# Patient Record
Sex: Male | Born: 1975
Health system: Southern US, Community
[De-identification: ages and names within clinical notes are randomized; demographics above are authoritative.]

## PROBLEM LIST (undated history)

## (undated) DIAGNOSIS — M109 Gout, unspecified: Secondary | ICD-10-CM

## (undated) HISTORY — PX: APPENDECTOMY: SHX54

## (undated) HISTORY — DX: Gout, unspecified: M10.9

---

## 2014-12-24 ENCOUNTER — Ambulatory Visit (INDEPENDENT_AMBULATORY_CARE_PROVIDER_SITE_OTHER): Payer: 59 | Admitting: Physician Assistant

## 2014-12-24 VITALS — BP 118/86 | HR 86 | Temp 97.8°F | Resp 16 | Ht 70.0 in | Wt 186.0 lb

## 2014-12-24 DIAGNOSIS — Z23 Encounter for immunization: Secondary | ICD-10-CM | POA: Diagnosis not present

## 2014-12-24 DIAGNOSIS — M79672 Pain in left foot: Secondary | ICD-10-CM

## 2014-12-24 MED ORDER — MELOXICAM 15 MG PO TABS: 15.0000 mg | ORAL_TABLET | Freq: Every day | ORAL | Status: DC

## 2014-12-24 NOTE — Progress Notes (Signed)
Subjective:   Patient ID: Mason FlockCHRISTOPHER Harris, male     DOB: [birth year], 39 y.o.    MRN: [id number]  PCP: No PCP Per Patient  Chief Complaint  Patient presents with  . Foot Pain    Left, onset yesterday    HPI  Presents for evaluation of LEFT foot pain  Started yesterday morning when he awoke. Feels "like a real bad jellyfish sting." Pain at rest, much worse with weight bearing.  Awoke him from sleep this morning about 2 am. Tried ice without benefit.  Had "the exact same thing" 2 years ago, and was evaluated elsewhere. Xrays were negative. He was given meloxicam and an antibiotic, and told that they had no idea what was causing it but hoped that one of those medications would help. The symptoms resolved.  No recent trauma or injury. No change in shoes or recent prolonged positions (like crouching) that he can recall). No history of gout.  Prior to Admission medications   Not on File     No Known Allergies   There are no active problems to display for this patient.    Family History  Problem Relation Age of Onset  . Hyperlipidemia Father      Social History   Social History  . Marital Status: Married    Spouse Name: Carollee HerterShannon  . Number of Children: 2  . Years of Education: college   Occupational History  . apartment Dealercomplex manager    Social History Main Topics  . Smoking status: Former Games developermoker  . Smokeless tobacco: Current User  . Alcohol Use: 2.4 - 3.0 oz/week    4-5 Standard drinks or equivalent per week  . Drug Use: No  . Sexual Activity: Not on file   Other Topics Concern  . Not on file   Social History Narrative   Lives with his wife and their two daughters.        Review of Systems  Constitutional: Negative for fever and chills.  Musculoskeletal: Positive for joint swelling, arthralgias and gait problem. Negative for myalgias and back pain.  Skin: Negative for color change, rash and wound.  Neurological: Negative for weakness and  numbness.         Objective:  Physical Exam  Constitutional: He is oriented to person, place, and time. He appears well-developed and well-nourished. He is active and cooperative. No distress.  BP 118/86 mmHg  Pulse 86  Temp(Src) 97.8 F (36.6 C) (Oral)  Resp 16  Ht 5\' 10"  (1.778 m)  Wt 186 lb (84.369 kg)  BMI 26.69 kg/m2  SpO2 98%   Eyes: Conjunctivae are normal.  Cardiovascular:  Pulses:      Dorsalis pedis pulses are 2+ on the right side, and 2+ on the left side.       Posterior tibial pulses are 2+ on the right side, and 2+ on the left side.  Pulmonary/Chest: Effort normal.  Musculoskeletal:       Right ankle: Normal. Achilles tendon normal.       Left ankle: Normal. Achilles tendon normal.       Right lower leg: Normal.       Left lower leg: Normal.       Right foot: Normal.       Left foot: There is tenderness and swelling (very mild). There is normal range of motion, no bony tenderness, normal capillary refill, no crepitus, no deformity and no laceration.       Feet:  Neurological: He is alert  and oriented to person, place, and time.  Psychiatric: He has a normal mood and affect. His speech is normal and behavior is normal.             Assessment & Plan:  1. Acute foot pain, left No etiology identified. Consider gout, but no erythema and only minimal swelling. No evidence of cellulitis. NSAID. Rest. Elevate. RTC if symptoms worsen/persist. - meloxicam (MOBIC) 15 MG tablet; Take 1 tablet (15 mg total) by mouth daily.  Dispense: 30 tablet; Refill: 1  2. Need for influenza vaccination - Flu Vaccine QUAD 36+ mos IM   Fernande Bras, PA-C Physician Assistant-Certified [facility]

## 2014-12-24 NOTE — Patient Instructions (Signed)
Stay off of your foot as much as you can over the next several days.

## 2014-12-25 ENCOUNTER — Encounter (HOSPITAL_COMMUNITY): Payer: Self-pay | Admitting: Emergency Medicine

## 2014-12-25 ENCOUNTER — Emergency Department (HOSPITAL_COMMUNITY): Payer: 59

## 2014-12-25 ENCOUNTER — Emergency Department (HOSPITAL_COMMUNITY)
Admission: EM | Admit: 2014-12-25 | Discharge: 2014-12-25 | Disposition: A | Discharge status: Home / Self Care | Payer: 59 | Attending: Emergency Medicine | Admitting: Emergency Medicine

## 2014-12-25 DIAGNOSIS — Z87891 Personal history of nicotine dependence: Secondary | ICD-10-CM | POA: Insufficient documentation

## 2014-12-25 DIAGNOSIS — Z791 Long term (current) use of non-steroidal anti-inflammatories (NSAID): Secondary | ICD-10-CM | POA: Diagnosis not present

## 2014-12-25 DIAGNOSIS — M79672 Pain in left foot: Secondary | ICD-10-CM | POA: Insufficient documentation

## 2014-12-25 MED ORDER — HYDROCODONE-ACETAMINOPHEN 5-325 MG PO TABS: 1.0000 | ORAL_TABLET | Freq: Four times a day (QID) | ORAL | Status: DC | PRN

## 2014-12-25 MED ORDER — INDOMETHACIN 50 MG PO CAPS: 50.0000 mg | ORAL_CAPSULE | Freq: Three times a day (TID) | ORAL | Status: DC

## 2014-12-25 MED ORDER — PREDNISONE 10 MG PO TABS: ORAL_TABLET | ORAL | Status: DC

## 2014-12-25 NOTE — ED Provider Notes (Signed)
CSN: [id number]     Arrival date & time 12/25/14  0845 History   First MD Initiated Contact with Patient 12/25/14 [phone]     Chief Complaint  Patient presents with  . Foot Pain     (Consider location/radiation/quality/duration/timing/severity/associated sxs/prior Treatment) Patient is a [age] y.o. male presenting with lower extremity pain.  Foot Pain Associated symptoms include joint swelling. Pertinent negatives include no coughing.  39 yo CM presenting with left foot pain. Patient states it started slightly bothering him on Tuesday night, increasing in intensity throughout the past two days. On Tuesday, he went out for a family dinner consisting of steak and multiple glasses of wine. He was seen by urgent care yesterday (10/20) where he received Meloxicam. Despite using this medication, the area has become more swollen and painful. He now is having difficulty bearing weight secondary to pain. He denies any alleviating factors and admits to worsening pain with bearing weight. He also notes erythema of the area which he states has continued to grow in side.  No history of gout, no puncture wounds, insect bites, no IVDU, or other injuries to this foot.  No PCP   History reviewed. No pertinent past medical history. Past Surgical History  Procedure Laterality Date  . Appendectomy     Family History  Problem Relation Age of Onset  . Hyperlipidemia Father    Social History  Substance Use Topics  . Smoking status: Former Games developermoker  . Smokeless tobacco: Current User  . Alcohol Use: 2.4 - 3.0 oz/week    4-5 Standard drinks or equivalent per week    Review of Systems  Constitutional: Negative.   Respiratory: Negative for cough, chest tightness and shortness of breath.   Cardiovascular: Negative.   Musculoskeletal: Positive for joint swelling and gait problem.       Unable to bear weight secondary to pain.   Skin: Positive for color change.      Allergies  Review of patient's allergies  indicates no known allergies.  Home Medications   Prior to Admission medications   Medication Sig Start Date End Date Taking? Authorizing Provider  meloxicam (MOBIC) 15 MG tablet Take 1 tablet (15 mg total) by mouth daily. 12/24/14   Chelle Jeffery, PA-C   BP 119/93 mmHg  Pulse 70  Temp(Src) 97.4 F (36.3 C)  Resp 18  Ht 5\' 10"  (1.778 m)  Wt 186 lb (84.369 kg)  BMI 26.69 kg/m2  SpO2 96% Physical Exam  Constitutional: He is oriented to person, place, and time. He appears well-developed and well-nourished.  HENT:  Head: Normocephalic and atraumatic.  Cardiovascular: Normal rate, regular rhythm, normal heart sounds and intact distal pulses.  Exam reveals no gallop and no friction rub.   No murmur heard. Pulmonary/Chest: Effort normal and breath sounds normal. No respiratory distress. He has no wheezes. He has no rales. He exhibits no tenderness.  Abdominal: Soft. Bowel sounds are normal. He exhibits no distension and no mass. There is no tenderness. There is no rebound and no guarding.  Musculoskeletal: Normal range of motion.       Right foot: There is tenderness and swelling.       Feet:  Focal tenderness to the tarsal joint as depicted in image. Swelling of this area. Erythema surrounding this area. No increased warmth coming from the area. Did not appear cellulitic in nature.  Full ROM and 5/5 strength of bilateral LE Neurovascularly intact.   Neurological: He is alert and oriented to person, place, and time.  Skin: Skin is warm, dry and intact. There is erythema.  Psychiatric: He has a normal mood and affect. His behavior is normal. Judgment and thought content normal.  Vitals reviewed.   ED Course  Procedures (including critical care time) Labs Review Labs Reviewed - No data to display  Imaging Review No results found. I have personally reviewed and evaluated these images and lab results as part of my medical decision-making.   EKG Interpretation None      MDM     Final diagnoses:  None   Kaiden Mcmanigal presents with left foot paiPepperat has increased in intensity despite Meloxicam he received from the urgent care yesterday. No prior imaging of the foot had been done at this time. Differentials include stress fracture, gout, septic joint. Images were obtained.  After reviewing x-rays, differentials were between gout and septic joint. Because there were no fevers/chills, no increased warmth to the area, and a history suggestive of gout, indomethicin and prednisone were given for home, as well as Norco for pain. This was discussed with the patient who expressed a clear understanding of our treatment plan. I encouraged him to start seeing a PCP and to follow up with them (or urgent care) in 48 hours to ensure the joint was healing properly or to return if pain did not improve.       [facility] Tabor Bartram, PA-C 12/25/14 1418  Jerelyn Scott, MD 12/25/14 [phone]

## 2014-12-25 NOTE — ED Notes (Signed)
Pt c/o pain and swelling to left foot onset Wednesday morning. Pt denies recent injury. Pt seen at urgent care yesterday. Pt has tried prescribed meloxicam and hydrocodone with some relief from pain.

## 2014-12-25 NOTE — Discharge Instructions (Signed)
Take medications as prescribed.  Please return to clinic if fever/chills, worsening pain, or any additional concerns. Follow up with PCP or urgent care in approximately 48 hours to ensure proper healing.    Emergency Department Resource Guide 1) Find a Doctor and Pay Out of Pocket Although you won't have to find out who is covered by your insurance plan, it is a good idea to ask around and get recommendations. You will then need to call the office and see if the doctor you have chosen will accept you as a new patient and what types of options they offer for patients who are self-pay. Some doctors offer discounts or will set up payment plans for their patients who do not have insurance, but you will need to ask so you aren't surprised when you get to your appointment.  2) Contact Your Local Health Department Not all health departments have doctors that can see patients for sick visits, but many do, so it is worth a call to see if yours does. If you don't know where your local health department is, you can check in your phone book. The CDC also has a tool to help you locate your state's health department, and many state websites also have listings of all of their local health departments.  3) Find a Walk-in Clinic If your illness is not likely to be very severe or complicated, you may want to try a walk in clinic. These are popping up all over the country in pharmacies, drugstores, and shopping centers. They're usually staffed by nurse practitioners or physician assistants that have been trained to treat common illnesses and complaints. They're usually fairly quick and inexpensive. However, if you have serious medical issues or chronic medical problems, these are probably not your best option.  No Primary Care Doctor: - Call Health Connect at  [phone] - they can help you locate a primary care doctor that  accepts your insurance, provides certain services, etc. - Physician Referral Service-  [phone]  Chronic Pain Problems: Organization         Address  Phone   Notes  [facility]  [phone] Patients need to be referred by their primary care doctor.   Medication Assistance: Organization         Address  Phone   Notes  [facility] [address] [place], [place] [address] [phone]) [phone] --Must be a resident of [facility] -- Must have NO insurance coverage whatsoever (no Medicaid/ Medicare, etc.) -- The pt. MUST have a primary care doctor that directs their care regularly and follows them in the community   MedAssist  [phone]   [employer]  [phone]    Agencies that provide inexpensive medical care: Organization         Address  Phone   Notes  [facility]  [phone]   [facility]    [phone]) [phone]   [facility] [address] [place], [place] [address] [phone]) [phone]   [facility] [address], [place] [phone]   Planned Parenthood    [phone]   [facility]    [phone]   [facility]  [address], [place] Phone:  [phone](336) [phone], Fax:  [phone] Hours of Operation:  9 am - 6 pm, M-F.  Also accepts Medicaid/Medicare and self-pay.  [facility]  [address], [address], [place] Phone: [phone], Fax: [phone]. Hours of Operation:  8:30 am - 5:30 pm, M-F.  Also accepts Medicaid and self-pay.  [facility] [address], [place] Phone: [phone]   [place], [place], [place] [phone], Ext. [phone] Mondays & Thursdays: 7-9 AM.  First 15 patients are seen on a first come, first serve basis.    [place] Providers:  Organization         Address  Phone   Notes  [facility] [address],  [place] [phone] Also accepts self-pay patients.  [facility] [address], [place]  [phone]   [place], [address], [place] [phone]   [facility] [address] [place] [phone]   Lucianne Lei [address], [place]   [phone] Only accepts [place] Access [place] patients after they have their name applied to their card.   Self-Pay (no insurance) in [facility]:  Organization         Address  Phone   Notes  Sickle Cell Patients, [facility] [place] [phone]   [facility] [place] [phone]   [facility] [place]  [place], [place], [place] [phone]   [facility]/Dr. Osei-Bonsu  [address], [place] or [address], [place] [phone] Phone number for both [place] and [place] locations is the same.  [facility] [address], [place] [phone]   [facility] [address] [place] or [address] [phone]   [facility] [address] [place] [phone], phone; [phone], fax Sees patients 1st and 3rd Saturday of every month.  Must not qualify for public or private insurance (i.e. Medicaid, Medicare, [place] Health Choice, Veterans' Benefits)  Household income should be no more than 200% of the poverty level The clinic cannot treat you if you are pregnant or think you are pregnant  Sexually transmitted diseases are not treated at the clinic.    Dental Care: Organization         Address  Phone  Notes  [facility] Department of [facility] [place] [phone] Accepts children up to age 50 who are enrolled in  [place] or [place]; pregnant women with a Medicaid card; and children who have applied for Medicaid or [place] Health Choice, but were declined, whose parents can pay a reduced fee at time of service.  [facility] Department of [facility]  [address], [place] [phone] Accepts children up to age 26 who are enrolled in [place] or [place]; pregnant women with a Medicaid card; and children who have applied for Medicaid or [place] Health Choice, but were declined, whose parents can pay a reduced fee at time of service.  [place] Adult [facility] Access PROGRAM  [place] [phone] Patients are seen by appointment only. Walk-ins are not accepted. [place] will see patients 70 years of age and older. Monday - Tuesday (8am-5pm) Most Wednesdays (8:30-5pm) $30 per visit, cash only  [place] Adult  Dental Access PROGRAM  [address], [facility] [phone] Patients are seen by appointment only. Walk-ins are not accepted. [place] will see patients 42 years of age and older. One Wednesday Evening (Monthly: Volunteer Based).  $30 per visit, cash only  [place]  [phone] for adults; Children under age 54, call Graduate Pediatric Dentistry at [phone]. Children aged 65-14, please call [phone] to request a pediatric application.  Dental services are provided in all areas of dental care including fillings, crowns and bridges, complete and partial dentures, implants, gum treatment, root canals, and extractions. Preventive care is also provided. Treatment is provided to both adults and children. Patients are selected via a lottery and there is often a waiting list.   [facility] [address] [place]  [phone] [email or web address]   [facility] [address] [place], [place] [phone], Ext. [phone] Second and Fourth Thursday of each month, opens at 6:30  AM; Clinic ends at 9 AM.  Patients are seen on a first-come first-served basis, and a limited number are seen during each clinic.   [facility]  [address] Hillard Danker [place], [place] [phone]   Eligibility Requirements You must have lived in [place], [place], or [place] counties for at least the last three months.   You cannot be eligible for state or federal sponsored [employer], including [employer], [place], or [employer].   You generally cannot be eligible for healthcare insurance through your employer.    How to apply: Eligibility screenings are held every Tuesday and Wednesday afternoon from 1:00 pm until 4:00 pm. You do not need an appointment for the interview!  [facility] [address] [place], [place]   [place]  [place] Department  [place]  [phone]    Behavioral Health Resources in the Community: Intensive Outpatient Programs Organization         Address  Phone  Notes  [place]. [address], [place], [place] [phone]   [facility] Outpatient [address], [place], [place]   ADS: Alcohol & Drug Svcs [address] [place], [place]   [place] [address]  [place], [place] or [phone]   Substance Abuse Resources Organization         Address  Phone  Notes  Alcohol and Drug Services  [phone]   [place]  [phone]   The [place]   Chinita Pester  [phone]   Residential & Outpatient Substance Abuse Program  [phone]   Psychological Services Organization         Address  Phone  Notes  [facility] [place]  [place]  [phone]   [place] [address], [place] [phone] or  [phone]    Mobile Crisis Teams Organization         Address  Phone  Notes  [facility], Mobile Crisis Care Unit  [phone]   [facility]  [address] [place], [place]   [place] Levels [address] [place] [phone]    Self-Help/Support Groups Organization         Address  Phone             Notes  Mental  Health Assoc. of [place] - variety of support groups  [place] Call for more information  Narcotics Anonymous (NA), [facility]aring [facility] [address], [employer] [place]  2 meetings at this location   Special educational needs teacher         Address  Phone  Notes  ASAP Residential Treatment [place],    [place]  [phone]   [facility]  [address] [place] [address], [place], [place]   [place]ellie [place], [place] [phone] Admissions: 8am-3pm M-F  Incentives Substance [place] [address]    [place], [place] [phone]   [facility] [address] [place], [place], [place]   [facility] [address],  [place], [place]   Insight Programs - Intensive Outpatient [address], [place], [place]   [facility] [address]  [place], [place] [phone] or [phone]   [facility]idential Treatment Services (RTS) [address], [place], [place] Accepts Medicaid  [facility] [address]  [place] [phone] Substance Abuse/Addiction Treatment   [facility] Organization         Address  Phone  Notes  [facility]  [phone]   Domenic Schwab, PhD [address] Arlis Porta [place], [place]   [phone] or [phone]   [facility], [place] [phone]   [facility] [address],  [place], [place] [phone] Insurance/Medicaid/sponsorship through [facility] and Families [address], [address]                                    [place], [place] [phone] [place] [address], [place] [phone]    Dr. Adele Schilder  [phone]   [facility] Dept. 1) [address] [place] 2) [place] 3)  [place] [address], [place] [phone]  [phone]   [place] [phone] or [phone] (After Hours)

## 2015-11-29 ENCOUNTER — Encounter (HOSPITAL_COMMUNITY): Payer: Self-pay | Admitting: Emergency Medicine

## 2015-11-29 ENCOUNTER — Emergency Department (HOSPITAL_COMMUNITY)
Admission: EM | Admit: 2015-11-29 | Discharge: 2015-11-29 | Disposition: A | Discharge status: Home / Self Care | Payer: 59 | Attending: Emergency Medicine | Admitting: Emergency Medicine

## 2015-11-29 DIAGNOSIS — M62830 Muscle spasm of back: Secondary | ICD-10-CM | POA: Insufficient documentation

## 2015-11-29 DIAGNOSIS — Z87891 Personal history of nicotine dependence: Secondary | ICD-10-CM | POA: Diagnosis not present

## 2015-11-29 DIAGNOSIS — M545 Low back pain: Secondary | ICD-10-CM | POA: Diagnosis present

## 2015-11-29 MED ORDER — NAPROXEN 500 MG PO TABS: 500.0000 mg | ORAL_TABLET | Freq: Two times a day (BID) | ORAL | 0 refills | Status: DC

## 2015-11-29 MED ORDER — METHOCARBAMOL 500 MG PO TABS: 500.0000 mg | ORAL_TABLET | Freq: Two times a day (BID) | ORAL | 0 refills | Status: DC

## 2015-11-29 NOTE — ED Triage Notes (Addendum)
Rt back pain  Started yesterday has tried , ice heat motrin , not working , was given a flexeril by dad and that helped some, shooting radiating pain is one spot nothing helps but laysing on back, , no numbness tingling  Has control of bowels and bladder , can walk, denies injury  excpet when he stepped inhole   Last thursday

## 2015-11-29 NOTE — ED Notes (Signed)
Declined W/C at D/C and was escorted to lobby by RN. 

## 2015-11-29 NOTE — ED Provider Notes (Signed)
[facility]-EMERGENCY DEPT Provider Note   CSN: [id number] Arrival date & time: 11/29/15  1304   By signing my name below, I, Clovis PuAvnee Patel, attest that this documentation has been prepared under the direction and in the presence of  Felicie Mornavid Alaila Pillard, NP. Electronically Signed: Clovis PuAvnee Patel, ED Scribe. 11/29/15. 4:43 PM.   History   Chief Complaint Chief Complaint  Patient presents with  . Back Pain    The history is provided by the patient. No language interpreter was used.  Back Pain   This is a new problem. The current episode started yesterday. The problem occurs constantly. The problem has not changed since onset.The pain does not radiate. The pain is moderate. Exacerbated by: movement. Pertinent negatives include no numbness, no bowel incontinence, no bladder incontinence and no tingling. He has tried ice and heat for the symptoms.   HPI Comments:  Mason Harris is a [age] y.o. male who presents to the Emergency Department complaining of lower right sided back pain onset 1 days ago. Pt denies radiation of the pain. He notes the pain is exacerbated with movement. Pt states he was mowing the grass when he stepped in a hole but denies any immediate pain following this incident. He has interchangeably used ice and heat with no relief. Pt denies numbness or tingling in his BLE and bowel or bladder incontinence. He also denies any medical problems, allergies, or being on any medications.   History reviewed. No pertinent past medical history.  There are no active problems to display for this patient.   Past Surgical History:  Procedure Laterality Date  . APPENDECTOMY         Home Medications    Prior to Admission medications   Medication Sig Start Date End Date Taking? Authorizing Provider  HYDROcodone-acetaminophen (NORCO/VICODIN) 5-325 MG tablet Take 1-2 tablets by mouth every 6 (six) hours as needed for moderate pain. 12/25/14   Chase PicketJaime Pilcher Ward, PA-C  indomethacin (INDOCIN) 50 MG  capsule Take 1 capsule (50 mg total) by mouth 3 (three) times daily with meals. 12/25/14   Jaime Pilcher Ward, PA-C  meloxicam (MOBIC) 15 MG tablet Take 1 tablet (15 mg total) by mouth daily. 12/24/14   Chelle Jeffery, PA-C  predniSONE (DELTASONE) 10 MG tablet Take three tabs daily X 3 days, then take two tabs X 2 days, then take one tab daily x 2 days 12/25/14   Chase PicketJaime Pilcher Ward, PA-C    Family History Family History  Problem Relation Age of Onset  . Hyperlipidemia Father     Social History Social History  Substance Use Topics  . Smoking status: Former Games developermoker  . Smokeless tobacco: Current User  . Alcohol use 2.4 - 3.0 oz/week    4 - 5 Standard drinks or equivalent per week     Allergies   Review of patient's allergies indicates no known allergies.   Review of Systems Review of Systems  Gastrointestinal: Negative for bowel incontinence.  Genitourinary: Negative for bladder incontinence.  Musculoskeletal: Positive for back pain.  Neurological: Negative for tingling and numbness.  All other systems reviewed and are negative.    Physical Exam Updated Vital Signs BP 134/95 (BP Location: Right Arm)   Pulse 79   Temp 98.4 F (36.9 C) (Oral)   Resp 16   Wt 185 lb (83.9 kg)   SpO2 100%   BMI 26.54 kg/m   Physical Exam  Constitutional: He is oriented to person, place, and time. He appears well-developed and well-nourished. No distress.  HENT:  Head: Normocephalic and atraumatic.  Eyes: Conjunctivae are normal.  Cardiovascular: Normal rate.   Pulmonary/Chest: Effort normal.  Abdominal: He exhibits no distension.  Musculoskeletal:       Arms: Muscle tenderness in R lower back.   Neurological: He is alert and oriented to person, place, and time.  Skin: Skin is warm and dry.  Psychiatric: He has a normal mood and affect.  Nursing note and vitals reviewed.    ED Treatments / Results  DIAGNOSTIC STUDIES:  Oxygen Saturation is 100% on RA, normal by my  interpretation.    COORDINATION OF CARE:  4:38 PM Discussed treatment plan with pt at bedside and pt agreed to plan.  Labs (all labs ordered are listed, but only abnormal results are displayed) Labs Reviewed - No data to display  EKG  EKG Interpretation None       Radiology No results found.  Procedures Procedures (including critical care time)  Medications Ordered in ED Medications - No data to display   Initial Impression / Assessment and Plan / ED Course  I have reviewed the triage vital signs and the nursing notes.  Pertinent labs & imaging results that were available during my care of the patient were reviewed by me and considered in my medical decision making (see chart for details).  Clinical Course  Patient with back pain.  No neurological deficits and normal neuro exam.  Patient is ambulatory.  No loss of bowel or bladder control.  No concern for cauda equina.  No fever, night sweats, weight loss, h/o cancer, IVDA, no recent procedure to back. No urinary symptoms suggestive of UTI.  Supportive care and return precaution discussed. Appears safe for discharge at this time. Follow up as indicated in discharge paperwork.     Final Clinical Impressions(s) / ED Diagnoses   Final diagnoses:  Muscle spasm of back    New Prescriptions Discharge Medication List as of 11/29/2015  4:46 PM    START taking these medications   Details  methocarbamol (ROBAXIN) 500 MG tablet Take 1 tablet (500 mg total) by mouth 2 (two) times daily., Starting Mon 11/29/2015, Print    naproxen (NAPROSYN) 500 MG tablet Take 1 tablet (500 mg total) by mouth 2 (two) times daily., Starting Mon 11/29/2015, Print      I personally performed the services described in this documentation, which was scribed in my presence. The recorded information has been reviewed and is accurate.     Felicie Morn, NP 11/30/15 1610    Loren Racer, MD 12/01/15 [phone]

## 2015-11-30 ENCOUNTER — Encounter: Payer: Self-pay | Admitting: Family Medicine

## 2015-11-30 ENCOUNTER — Ambulatory Visit (INDEPENDENT_AMBULATORY_CARE_PROVIDER_SITE_OTHER): Payer: 59 | Admitting: Family Medicine

## 2015-11-30 DIAGNOSIS — M545 Low back pain, unspecified: Secondary | ICD-10-CM

## 2015-11-30 NOTE — Patient Instructions (Signed)
You have a lumbar strain with an irritated local nerve. Take naproxen 500mg  twice a day with food - when you run out of this you can take 2 aleve twice a day with food OR ibuprofen 600mg  three times a day with food for pain and inflammation. A prednisone dose pack is an option. Robaxin as needed for muscle spasms (no driving on this medicine if it makes you sleepy). Stay as active as possible. Physical therapy has been shown to be helpful as well - consider this in the future. Do home exercises daily for your back though you may want to wait a couple days before starting these. Strengthening of low back muscles, abdominal musculature are key for long term pain relief. If not improving, will consider further imaging (MRI). Follow up with me in 4 weeks typically but obviously call me in next 1-2 weeks if you're not improving as I would expect.

## 2015-12-02 DIAGNOSIS — M545 Low back pain, unspecified: Secondary | ICD-10-CM | POA: Insufficient documentation

## 2015-12-02 NOTE — Progress Notes (Signed)
PCP: No PCP Per Patient  Subjective:   HPI: Patient is a 40 y.o. male here for low back pain.  Patient reports 2 days ago he started to get pain a couple days after mowing the lawn (pain started 9/24). Remembers stepping in a hole while doing this but no initial pain. Developed pain on right side of low back. Was up to 10/10 and sharp with certain motions. Sunday was so bad that he couldn't get out of bed. No prior issues. No radiation into leg though. Tried naproxen, robaxin, heat/ice. No bowel/bladder dysfunction.  No past medical history on file.  Current Outpatient Prescriptions on File Prior to Visit  Medication Sig Dispense Refill  . HYDROcodone-acetaminophen (NORCO/VICODIN) 5-325 MG tablet Take 1-2 tablets by mouth every 6 (six) hours as needed for moderate pain. 12 tablet 0  . indomethacin (INDOCIN) 50 MG capsule Take 1 capsule (50 mg total) by mouth 3 (three) times daily with meals. 30 capsule 0  . meloxicam (MOBIC) 15 MG tablet Take 1 tablet (15 mg total) by mouth daily. 30 tablet 1  . methocarbamol (ROBAXIN) 500 MG tablet Take 1 tablet (500 mg total) by mouth 2 (two) times daily. 20 tablet 0  . naproxen (NAPROSYN) 500 MG tablet Take 1 tablet (500 mg total) by mouth 2 (two) times daily. 20 tablet 0  . predniSONE (DELTASONE) 10 MG tablet Take three tabs daily X 3 days, then take two tabs X 2 days, then take one tab daily x 2 days 15 tablet 0   No current facility-administered medications on file prior to visit.     Past Surgical History:  Procedure Laterality Date  . APPENDECTOMY      No Known Allergies  Social History   Social History  . Marital status: Married    Spouse name: Carollee HerterShannon  . Number of children: 2  . Years of education: college   Occupational History  . apartment Dealercomplex manager    Social History Main Topics  . Smoking status: Former Games developermoker  . Smokeless tobacco: Current User  . Alcohol use 2.4 - 3.0 oz/week    4 - 5 Standard drinks or  equivalent per week  . Drug use: No  . Sexual activity: Not on file   Other Topics Concern  . Not on file   Social History Narrative   Lives with his wife and their two daughters.    Family History  Problem Relation Age of Onset  . Hyperlipidemia Father     BP (!) 135/104   Pulse 81   Ht 5\' 10"  (1.778 m)   Wt 185 lb (83.9 kg)   BMI 26.54 kg/m   Review of Systems: See HPI above.    Objective:  Physical Exam:  Gen: NAD, comfortable in exam room  Back: No gross deformity, scoliosis. Minimal TTP right lumbar paraspinal region.  No midline or bony TTP.  No SI or piriformis tenderness. FROM. Strength LEs 5/5 all muscle groups.   2+ MSRs in patellar and achilles tendons, equal bilaterally. Negative SLRs. Sensation intact to light touch bilaterally. Negative logroll bilateral hips Negative fabers and piriformis stretches.    Assessment & Plan:  1. Low back injury - 2/2 lumbar strain.  Continue naproxen with robaxin as needed.  He is improving compared to when this occurred.  Start home exercises which were shown today.  Consider physical therapy if not improving.  F/u in 4 weeks.

## 2015-12-02 NOTE — Assessment & Plan Note (Signed)
2/2 lumbar strain.  Continue naproxen with robaxin as needed.  He is improving compared to when this occurred.  Start home exercises which were shown today.  Consider physical therapy if not improving.  F/u in 4 weeks.

## 2016-10-06 IMAGING — DX DG FOOT COMPLETE 3+V*L*
3 series · 3 of 3 positions shown · non-contrast
Comparison: None.

CLINICAL DATA: Redness and swelling.  Medial pain.

EXAM:
LEFT FOOT - COMPLETE 3+ VIEW

[x foot ap left]
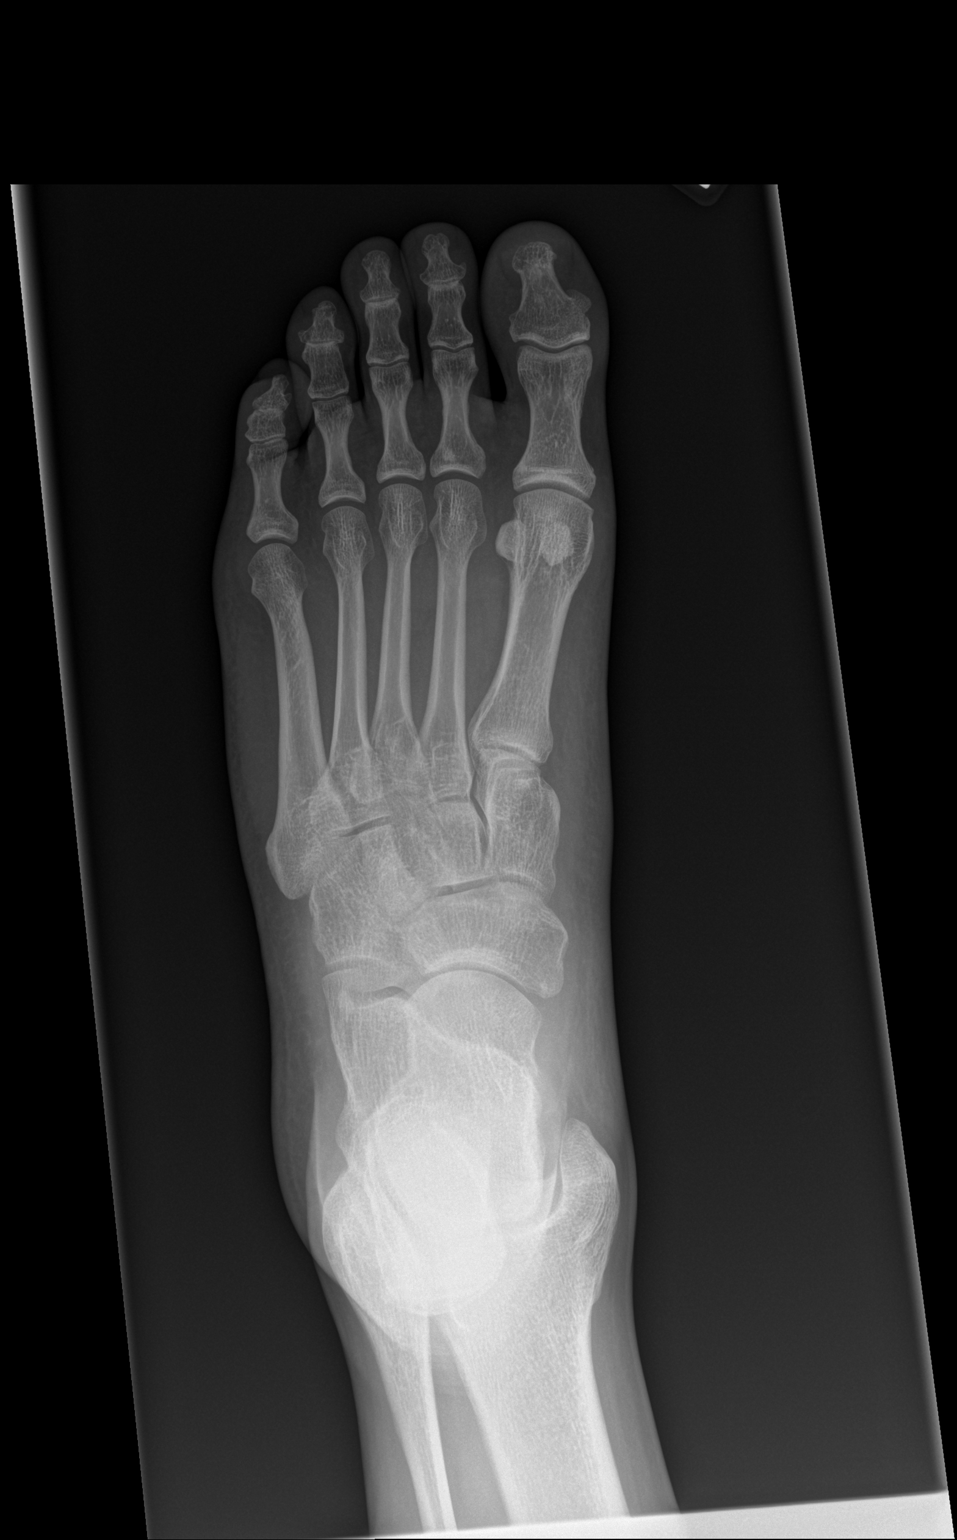

[x foot obl left]
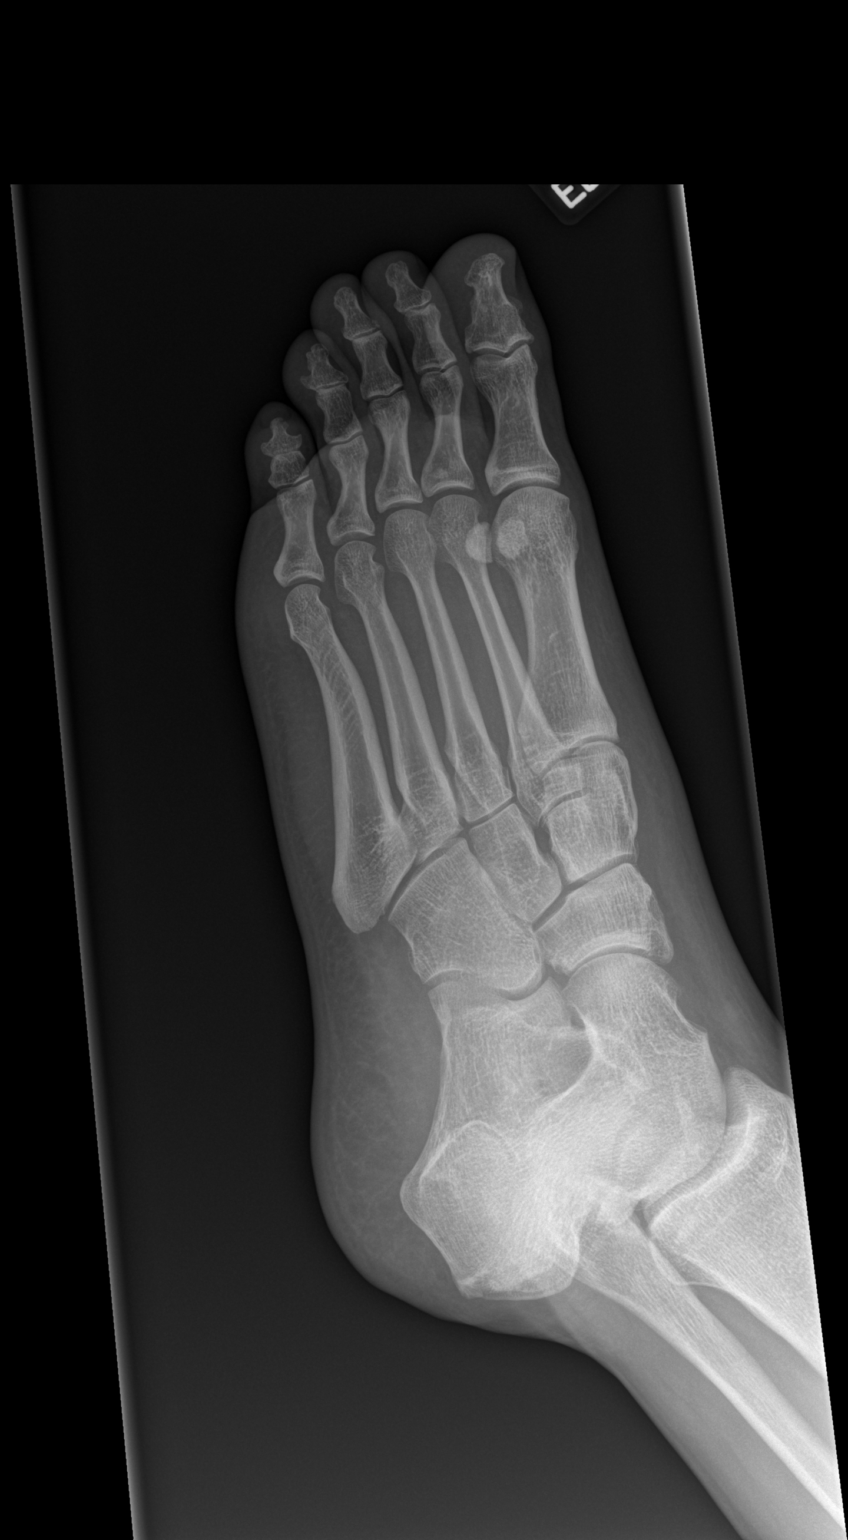

[x foot lat left]
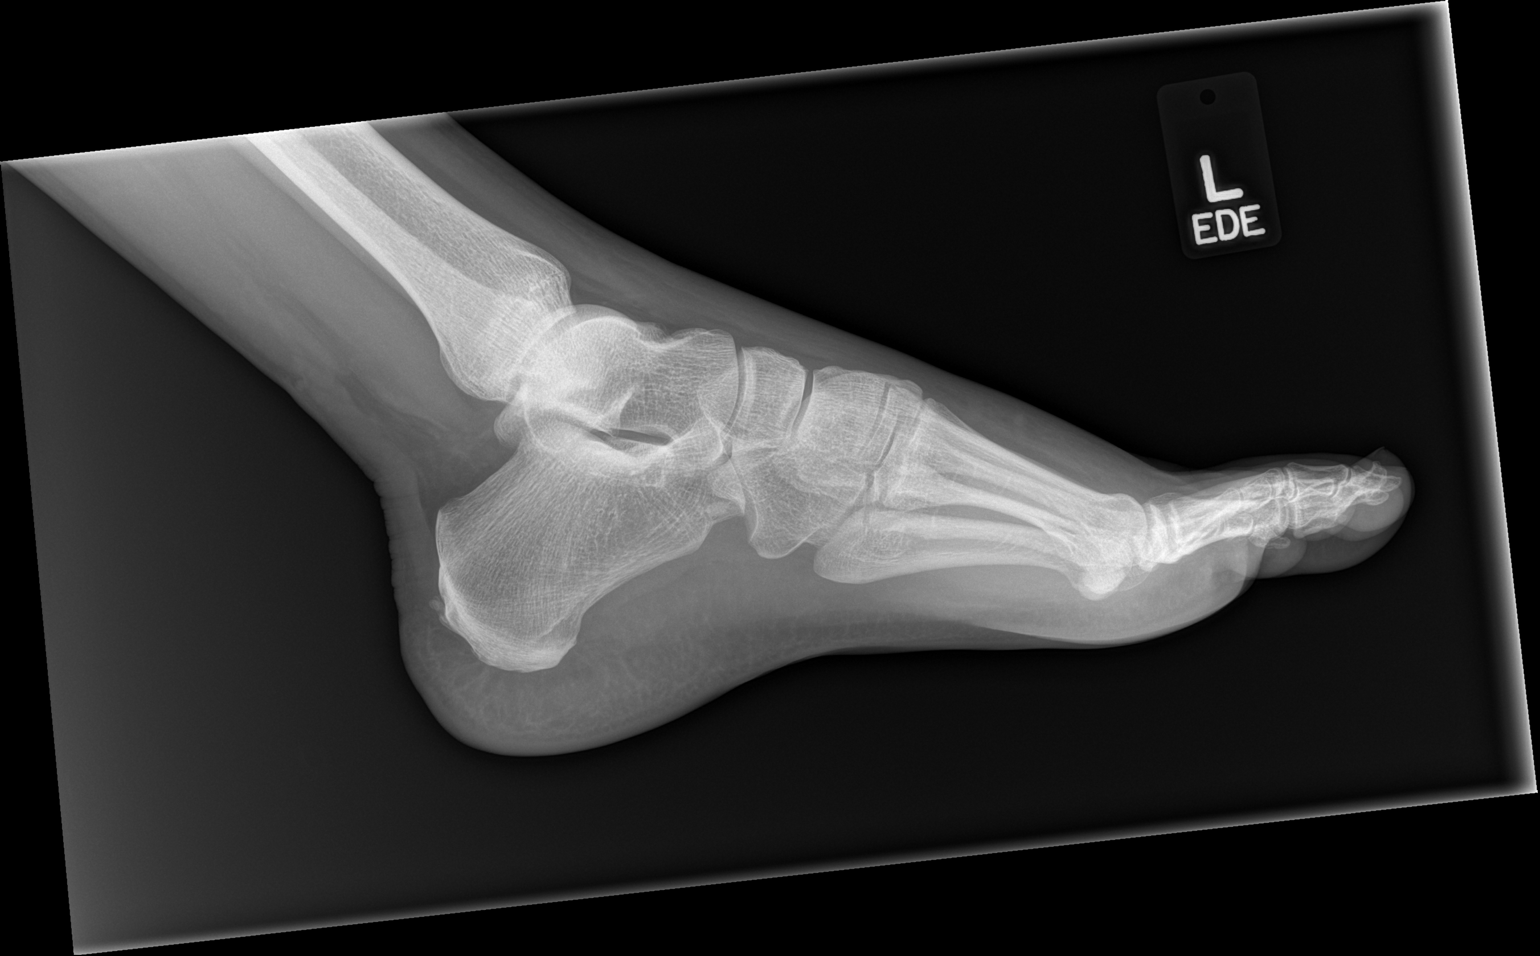

[3 of 3 positions shown; findings below may reference images not displayed]

FINDINGS: There is no evidence of fracture or dislocation. There is no
evidence of arthropathy or other focal bone abnormality. Soft
tissues are unremarkable.
IMPRESSION: No acute osseous injury of the left foot.

## 2017-03-14 ENCOUNTER — Other Ambulatory Visit: Payer: Self-pay

## 2017-03-14 ENCOUNTER — Encounter: Payer: Self-pay | Admitting: Physician Assistant

## 2017-03-14 ENCOUNTER — Ambulatory Visit: Payer: 59 | Admitting: Physician Assistant

## 2017-03-14 VITALS — BP 124/88 | HR 88 | Temp 97.6°F | Resp 16 | Ht 70.0 in | Wt 191.2 lb

## 2017-03-14 DIAGNOSIS — M109 Gout, unspecified: Secondary | ICD-10-CM

## 2017-03-14 MED ORDER — COLCHICINE 0.6 MG PO TABS: ORAL_TABLET | ORAL | 2 refills | Status: DC

## 2017-03-14 NOTE — Patient Instructions (Addendum)
  Come back and see me if you are not getting better.     IF you received an x-ray today, you will receive an invoice from [facility]. Please contact [facility] at [phone] with questions or concerns regarding your invoice.   IF you received labwork today, you will receive an invoice from [facility]. Please contact [facility] at [phone] with questions or concerns regarding your invoice.   Our billing staff will not be able to assist you with questions regarding bills from these companies.  You will be contacted with the lab results as soon as they are available. The fastest way to get your results is to activate your My Chart account. Instructions are located on the last page of this paperwork. If you have not heard from us regarding the results in 2 weeks, please contact this office.

## 2017-03-14 NOTE — Progress Notes (Signed)
    03/14/2017 3:59 PM   DOB: 03/18/1975 / MRN: [id number]  SUBJECTIVE:  Mason Harris is a 42 y.o. male presenting for lateral foot pain time 3 days. No trauma.  He has a history of gout and tells me this feels like that.  Tends to take a steroid and NSAIDS while this occurs.  He gets maybe 2-4 flares a year.  Occurs with undercooked steak and white wine, both of which he had 5 days ago, or if he eats seafood, which he has not had. Denies a history of CKD.  Takes no meds chronically.   He has No Known Allergies.   He  has no past medical history on file.    He  reports that he has quit smoking. He uses smokeless tobacco. He reports that he drinks about 2.4 - 3.0 oz of alcohol per week. He reports that he does not use drugs. He  has no sexual activity history on file. The patient  has a past surgical history that includes Appendectomy.  His family history includes Hyperlipidemia in his father.  Review of Systems  Constitutional: Negative for fever.  Musculoskeletal: Positive for joint pain and myalgias.    The problem list and medications were reviewed and updated by myself where necessary and exist elsewhere in the encounter.   OBJECTIVE:  BP 124/88 (BP Location: Right Arm, Patient Position: Sitting, Cuff Size: Normal)   Pulse 88   Temp 97.6 F (36.4 C) (Oral)   Resp 16   Ht 5\' 10"  (1.778 m)   Wt 191 lb 3.2 oz (86.7 kg)   SpO2 99%   BMI 27.43 kg/m   Physical Exam  Constitutional: He is active and cooperative.  Cardiovascular: Normal rate, regular rhythm, S1 normal, S2 normal, normal heart sounds, intact distal pulses and normal pulses. Exam reveals no gallop and no friction rub.  No murmur heard. Pulmonary/Chest: Effort normal. No tachypnea. He has no rales.  Abdominal: He exhibits no distension.  Musculoskeletal: He exhibits no edema.       Feet:  Neurological: He is alert.  Skin: Skin is warm.  Vitals reviewed.   No results found for: CREATININE, BUN, NA, K, CL,  CO2   No results found for this or any previous visit (from the past 72 hour(s)).  No results found.  ASSESSMENT AND PLAN:  [name] was seen today for foot pain.  Diagnoses and all orders for this visit:  Acute gout of right foot, unspecified cause: Similar to previous symptoms.  He is otherwise healthy and takes no meds. He will come back for labs and further investigation if the colchicine is not working for him.  -     colchicine 0.6 MG tablet; Take two tabs then one tab one hour later.  Okay to repeat in 24 hours.    The patient is advised to call or return to clinic if he does not see an improvement in symptoms, or to seek the care of the closest emergency department if he worsens with the above plan.   Deliah BostonMichael Luba Matzen, MHS, PA-C [facility] at [facility] 03/14/2017 3:59 PM

## 2017-03-15 ENCOUNTER — Ambulatory Visit: Payer: 59 | Admitting: Family Medicine

## 2017-03-15 ENCOUNTER — Encounter: Payer: Self-pay | Admitting: Family Medicine

## 2017-03-15 ENCOUNTER — Telehealth: Payer: Self-pay | Admitting: Physician Assistant

## 2017-03-15 ENCOUNTER — Other Ambulatory Visit: Payer: Self-pay

## 2017-03-15 VITALS — BP 118/80 | HR 99 | Temp 98.4°F | Resp 17 | Ht 70.0 in | Wt 186.8 lb

## 2017-03-15 DIAGNOSIS — M10071 Idiopathic gout, right ankle and foot: Secondary | ICD-10-CM

## 2017-03-15 MED ORDER — INDOMETHACIN 50 MG PO CAPS: 50.0000 mg | ORAL_CAPSULE | Freq: Three times a day (TID) | ORAL | 0 refills | Status: DC

## 2017-03-15 MED ORDER — PREDNISONE 10 MG PO TABS: ORAL_TABLET | ORAL | 0 refills | Status: DC

## 2017-03-15 NOTE — Progress Notes (Signed)
Chief Complaint  Patient presents with  . right foot pain    per pt it is gout, hx of gout flares    HPI   Pt was started on Colchicine but developed diarrhea and could not tolerate so he stopped the medication He reports that his foot has become more swollen in the past 24 hours  He previously had to use prednisone for his exacerbations His triggers are typically shrimp, sometimes meat but not consistently    No past medical history on file.  Current Outpatient Medications  Medication Sig Dispense Refill  . colchicine 0.6 MG tablet Take two tabs then one tab one hour later.  Okay to repeat in 24 hours. 12 tablet 2  . indomethacin (INDOCIN) 50 MG capsule Take 1 capsule (50 mg total) by mouth 3 (three) times daily with meals. 30 capsule 0  . predniSONE (DELTASONE) 10 MG tablet Take three tabs daily X 3 days, then take two tabs X 2 days, then take one tab daily x 2 days 15 tablet 0   No current facility-administered medications for this visit.     Allergies: No Known Allergies  Past Surgical History:  Procedure Laterality Date  . APPENDECTOMY      Social History   Socioeconomic History  . Marital status: Married    Spouse name: Carollee Herter  . Number of children: 2  . Years of education: college  . Highest education level: None  Social Needs  . Financial resource strain: None  . Food insecurity - worry: None  . Food insecurity - inability: None  . Transportation needs - medical: None  . Transportation needs - non-medical: None  Occupational History  . Occupation: apartment Dealer  Tobacco Use  . Smoking status: Former Games developer  . Smokeless tobacco: Current User  Substance and Sexual Activity  . Alcohol use: Yes    Alcohol/week: 2.4 - 3.0 oz    Types: 4 - 5 Standard drinks or equivalent per week  . Drug use: No  . Sexual activity: None  Other Topics Concern  . None  Social History Narrative   Lives with his wife and their two daughters.    Family  History  Problem Relation Age of Onset  . Hyperlipidemia Father      ROS Review of Systems See HPI Constitution: No fevers or chills No malaise No diaphoresis Skin: No rash or itching Eyes: no blurry vision, no double vision GU: no dysuria or hematuria Neuro: no dizziness or headaches all others reviewed and negative   Objective: Vitals:   03/15/17 1202  BP: 118/80  Pulse: 99  Resp: 17  Temp: 98.4 F (36.9 C)  TempSrc: Oral  SpO2: 95%  Weight: 186 lb 12.8 oz (84.7 kg)  Height: 5\' 10"  (1.778 m)    Physical Exam  Constitutional: He is oriented to person, place, and time. He appears well-developed and well-nourished.  HENT:  Head: Normocephalic and atraumatic.  Eyes: Conjunctivae and EOM are normal.  Cardiovascular: Normal rate, regular rhythm and normal heart sounds.  No murmur heard. Pulmonary/Chest: Effort normal and breath sounds normal. No stridor. No respiratory distress.  Musculoskeletal:       Feet:  Neurological: He is alert and oriented to person, place, and time.  Skin: Skin is warm. Capillary refill takes less than 2 seconds.  Psychiatric: He has a normal mood and affect. His behavior is normal. Judgment and thought content normal.   Component     Latest Ref Rng & Units 03/15/2017  Glucose     65 - 99 mg/dL 098102 (H)  BUN     6 - 24 mg/dL 13  Creatinine     1.190.76 - 1.27 mg/dL 1.471.10  GFR, Est Non African American     >59 mL/min/1.73 82  GFR, Est African American     >59 mL/min/1.73 95  BUN/Creatinine Ratio     9 - 20 12  Sodium     134 - 144 mmol/L 143  Potassium     3.5 - 5.2 mmol/L 4.6  Chloride     96 - 106 mmol/L 104  CO2     20 - 29 mmol/L 22  Calcium     8.7 - 10.2 mg/dL 9.7  Uric Acid     3.7 - 8.6 mg/dL 8.1    Assessment and Plan Mason Harris was seen today for right foot pain.  Diagnoses and all orders for this visit:  Acute idiopathic gout of right foot- will renal function  Discussed that his uric acid will probably be low  since he is in a gout attack Explained the mechanism of gout Discussed triggers Refilled prednisone and indomethacin -     Uric Acid -     Basic metabolic panel -     predniSONE (DELTASONE) 10 MG tablet; Take three tabs daily X 3 days, then take two tabs X 2 days, then take one tab daily x 2 days -     indomethacin (INDOCIN) 50 MG capsule; Take 1 capsule (50 mg total) by mouth 3 (three) times daily with meals.     Zamiyah Resendes A Breiana Stratmann

## 2017-03-15 NOTE — Telephone Encounter (Signed)
Copied from CRM [phone]#[id number]. Topic: General - Other >> Mar 15, 2017  7:56 AM Gerrianne ScalePayne, Anayansi Rundquist L wrote: Reason for CRM: patient calling stating that Deliah BostonMichael clark had put him on  colchicine 0.6 MG tablet  for gout in his rt foot he states that is has gotten worst the medicine didn't help but it has given him diarrhea he hasn't been able to sleep or walk patient states that what do work is the predisone and meloxicam together please sent RX to the [id number] [place] - [address] [facility] [address] [phone] (Phone) [phone] (Fax)

## 2017-03-15 NOTE — Patient Instructions (Signed)
     IF you received an x-ray today, you will receive an invoice from [facility]. Please contact [facility] at [phone] with questions or concerns regarding your invoice.   IF you received labwork today, you will receive an invoice from [facility]. Please contact [facility] at [phone] with questions or concerns regarding your invoice.   Our billing staff will not be able to assist you with questions regarding bills from these companies.  You will be contacted with the lab results as soon as they are available. The fastest way to get your results is to activate your My Chart account. Instructions are located on the last page of this paperwork. If you have not heard from us regarding the results in 2 weeks, please contact this office.     

## 2017-03-16 LAB — BASIC METABOLIC PANEL
BUN/Creatinine Ratio: 12 (ref 9–20)
BUN: 13 mg/dL (ref 6–24)
CO2: 22 mmol/L (ref 20–29)
Calcium: 9.7 mg/dL (ref 8.7–10.2)
Chloride: 104 mmol/L (ref 96–106)
Creatinine, Ser: 1.1 mg/dL (ref 0.76–1.27)
GFR calc Af Amer: 95 mL/min/{1.73_m2} (ref >59)
GFR, EST NON AFRICAN AMERICAN: 82 mL/min/{1.73_m2} (ref >59)
GLUCOSE: 102 mg/dL — AB (ref 65–99)
Potassium: 4.6 mmol/L (ref 3.5–5.2)
SODIUM: 143 mmol/L (ref 134–144)

## 2017-03-16 LAB — URIC ACID: Uric Acid: 8.1 mg/dL (ref 3.7–8.6)

## 2018-05-16 ENCOUNTER — Other Ambulatory Visit: Payer: Self-pay | Admitting: Physician Assistant

## 2018-05-16 DIAGNOSIS — M109 Gout, unspecified: Secondary | ICD-10-CM

## 2018-06-18 DIAGNOSIS — S93412A Sprain of calcaneofibular ligament of left ankle, initial encounter: Secondary | ICD-10-CM | POA: Diagnosis not present

## 2019-02-21 ENCOUNTER — Other Ambulatory Visit: Payer: Self-pay

## 2019-02-21 DIAGNOSIS — Z20822 Contact with and (suspected) exposure to covid-19: Secondary | ICD-10-CM

## 2019-02-22 LAB — NOVEL CORONAVIRUS, NAA: SARS-CoV-2, NAA: NOT DETECTED

## 2019-05-17 ENCOUNTER — Ambulatory Visit: Payer: 59 | Attending: Internal Medicine

## 2019-05-17 DIAGNOSIS — Z23 Encounter for immunization: Secondary | ICD-10-CM

## 2019-05-17 NOTE — Progress Notes (Signed)
   Covid-19 Vaccination Clinic  Name:  Mason Harris    MRN: [id number] DOB: [birth year]  05/17/2019  [name] was observed post Covid-19 immunization for 15 minutes without incident. He was provided with Vaccine Information Sheet and instruction to access the V-Safe system.   [name] was instructed to call 911 with any severe reactions post vaccine: Marland Kitchen Difficulty breathing  . Swelling of face and throat  . A fast heartbeat  . A bad rash all over body  . Dizziness and weakness   Immunizations Administered    Name Date Dose VIS Date Route   Pfizer COVID-19 Vaccine 05/17/2019 11:44 AM 0.3 mL 02/14/2019 Intramuscular   Manufacturer: [employer], [employer]   Lot: [id number]   NDC: [address]-2

## 2019-06-09 ENCOUNTER — Ambulatory Visit: Payer: 59

## 2019-06-10 ENCOUNTER — Ambulatory Visit: Payer: 59 | Attending: Internal Medicine

## 2019-06-10 DIAGNOSIS — Z23 Encounter for immunization: Secondary | ICD-10-CM

## 2019-06-10 NOTE — Progress Notes (Signed)
   Covid-19 Vaccination Clinic  Name:  Mason Harris    MRN: [id number] DOB: 06-01-1975  06/10/2019  [name] was observed post Covid-19 immunization for 15 minutes without incident. He was provided with Vaccine Information Sheet and instruction to access the V-Safe system.   [name] was instructed to call 911 with any severe reactions post vaccine: Marland Kitchen Difficulty breathing  . Swelling of face and throat  . A fast heartbeat  . A bad rash all over body  . Dizziness and weakness   Immunizations Administered    Name Date Dose VIS Date Route   Pfizer COVID-19 Vaccine 06/10/2019  9:56 AM 0.3 mL 02/14/2019 Intramuscular   Manufacturer: [employer], [employer]   Lot: [id number]   NDC: [address]-7

## 2020-01-11 ENCOUNTER — Telehealth: Payer: 59 | Admitting: Family

## 2020-01-11 DIAGNOSIS — M109 Gout, unspecified: Secondary | ICD-10-CM

## 2020-01-11 MED ORDER — PREDNISONE 20 MG PO TABS: 40.0000 mg | ORAL_TABLET | Freq: Every day | ORAL | 0 refills | Status: AC

## 2020-01-11 MED ORDER — COLCHICINE 0.6 MG PO TABS: ORAL_TABLET | ORAL | 0 refills | Status: DC

## 2020-01-11 NOTE — Progress Notes (Signed)
E-Visit for Gout  We are sorry that you are not feeling well. We are here to help!  Based on what you shared with me it looks like you have a flare of your gout.  Gout is a form of arthritis. It can cause pain and swelling in the joints. At first, it tends to affect only 1 joint - most frequently the big toe. It happens in people who have too much uric acid in the blood. Uric acid is a chemical that is produced when the body breaks down certain foods. Uric acid can form sharp needle-like crystals that build up in the joints and cause pain. Uric acid crystals can also form inside the tubes that carry urine from the kidneys to the bladder. These crystals can turn into "kidney stones" that can cause pain and problems with the flow of urine. People with gout get sudden "flares" or attacks of severe pain, most often the big toe, ankle, or knee. Often the joint also turns red and swells. Usually, only 1 joint is affected, but some people have pain in more than 1 joint. Gout flares tend to happen more often during the night.  The pain from gout can be extreme. The pain and swelling are worst at the beginning of a gout flare. The symptoms then get better within a few days to weeks. It is not clear how the body "turns off" a gout flare.  Do not start any NEW preventative medicine until the gout has cleared completely. However, If you are already on Probenecid or Allopurinol for CHRONIC gout, you may continue taking this during an active flare up  I have prescribed Colchicine 0.6 mg tabs - Take 2 tabs immediately, then 1 tab twice per day for the duration of the flare up to a max of 7 days (but discontinue for stomach pains or diarrhea)  and I have prescribed Prednisone 40 mg daily for 7   HOME CARE Losing weight can help relieve gout. It's not clear that following a specific diet plan will help with gout symptoms but eating a balanced diet can help improve your overall health. It can also help you lose weight,  if you are overweight. In general, a healthy diet includes plenty of fruits, vegetables, whole grains, and low-fat dairy products (labelled "low fat", skim, 2%). Avoid sugar sweetened drinks (including sodas, tea, juice and juice blends, coffee drinks and sports drinks) Limit alcohol to 1-2 drinks of beer, spirits or wine daily these can make gout flares worse. Some people with gout also have other health problems, such as heart disease, high blood pressure, kidney disease, or obesity. If you have any of these issues, it's important to work with your doctor to manage them. This can help improve your overall health and might also help with your gout.  GET HELP RIGHT AWAY IF: . Your symptoms persist after you have completed your treatment plan . You develop severe diarrhea . You develop abnormal sensations .  You develop vomiting,  .  You develop weakness .  You develop abdominal pain  FOLLOW UP WITH YOUR PRIMARY PROVIDER IF: . If your symptoms do not improve within 10 days  MAKE SURE YOU   Understand these instructions.  Will watch your condition.  Will get help right away if you are not doing well or get worse.  Your e-visit answers were reviewed by a board certified advanced clinical practitioner to complete your personal care plan. Depending upon the condition, your plan could have   included both over the counter or prescription medications.  Your safety is important to us. If you have drug allergies check your prescription carefully.   You can use MyChart to ask questions about today's visit, request a non-urgent call back, or ask for a work or school excuse for 24 hours related to this e-Visit. If it has been greater than 24 hours you will need to follow up with your provider, or enter a new e-Visit to address those concerns. You will get an e-mail with a link to a survey asking about your experience.  We hope that your e-visit has been valuable and will speed your recovery! Thank you  for using e-visits.    Approximately 5 minutes was spent documenting and reviewing patient's chart.     

## 2020-11-18 ENCOUNTER — Telehealth: Payer: 59 | Admitting: Emergency Medicine

## 2020-11-18 DIAGNOSIS — M109 Gout, unspecified: Secondary | ICD-10-CM | POA: Diagnosis not present

## 2020-11-18 MED ORDER — COLCHICINE 0.6 MG PO TABS: ORAL_TABLET | ORAL | 0 refills | Status: DC

## 2020-11-18 NOTE — Patient Instructions (Signed)
If you have worsening symptoms such as fever, redness that is streaking up your leg, or worsening pain you will need to be seen in person.

## 2020-11-18 NOTE — Progress Notes (Signed)
Virtual Visit Consent   BRELAN Harris, you are scheduled for a virtual visit with a [facility] provider today.     Just as with appointments in the office, your consent must be obtained to participate.  Your consent will be active for this visit and any virtual visit you may have with one of our providers in the next 365 days.     If you have a MyChart account, a copy of this consent can be sent to you electronically.  All virtual visits are billed to your insurance company just like a traditional visit in the office.    As this is a virtual visit, video technology does not allow for your provider to perform a traditional examination.  This may limit your provider's ability to fully assess your condition.  If your provider identifies any concerns that need to be evaluated in person or the need to arrange testing (such as labs, EKG, etc.), we will make arrangements to do so.     Although advances in technology are sophisticated, we cannot ensure that it will always work on either your end or our end.  If the connection with a video visit is poor, the visit may have to be switched to a telephone visit.  With either a video or telephone visit, we are not always able to ensure that we have a secure connection.     I need to obtain your verbal consent now.   Are you willing to proceed with your visit today?    Delmore Kille has provided verbal consent on 11/18/2020 for a virtual visit  telephone.   Mason Horseman, PA-C   Date: 11/18/2020 10:58 AM   Virtual Visit via Video Note   I, Mason Harris, connected with  Mason Harris  ([id number], Jan 15, 1976) on 11/18/20 at 11:00 AM EDT by a video-enabled telemedicine application and verified that I am speaking with the correct person using two identifiers.  Location: Patient: Virtual Visit Location Patient: Home Provider: Virtual Visit Location Provider: Home Office   I discussed the limitations of evaluation and management by  telemedicine and the availability of in person appointments. The patient expressed understanding and agreed to proceed.    History of Present Illness: Mason Harris is a 45 y.o. who identifies as a male who was assigned male at birth, and is being seen today for gout.  Has history of gout.  States that this feels exactly the same.  States that it is on the right foot.  States that around the midfoot.  Denies fever.  Denies any injuries.  States that he has taken colchicine.   HPI: HPI  Problems:  Patient Active Problem List   Diagnosis Date Noted   Low back pain 12/02/2015    Allergies: No Known Allergies Medications:  Current Outpatient Medications:    colchicine 0.6 MG tablet, Take 2 tabs immediately, then 1 tab twice per day for the duration of the flare up to a max of 7 days, Disp: 21 tablet, Rfl: 0   indomethacin (INDOCIN) 50 MG capsule, Take 1 capsule (50 mg total) by mouth 3 (three) times daily with meals., Disp: 30 capsule, Rfl: 0  Observations/Objective: Patient is well-developed, well-nourished in no acute distress.  Resting comfortably at home.  Head is normocephalic, atraumatic.  No labored breathing.  Speech is clear and coherent with logical content.  Patient is alert and oriented at baseline.  Video functional, but audio failed.  Called patient on telephone to complete visit.  Assessment and  Plan: 1. Gout of foot, unspecified cause, unspecified chronicity, unspecified laterality - Colchicine  Follow Up Instructions: I discussed the assessment and treatment plan with the patient. The patient was provided an opportunity to ask questions and all were answered. The patient agreed with the plan and demonstrated an understanding of the instructions.  A copy of instructions were sent to the patient via MyChart.  The patient was advised to call back or seek an in-person evaluation if the symptoms worsen or if the condition fails to improve as anticipated.  Time:  I  spent 10 minutes with the patient via telehealth technology discussing the above problems/concerns.    Mason Horseman, PA-C

## 2021-10-25 ENCOUNTER — Encounter: Payer: 59 | Admitting: Physician Assistant

## 2021-10-25 ENCOUNTER — Telehealth: Payer: 59 | Admitting: Physician Assistant

## 2021-10-25 DIAGNOSIS — M109 Gout, unspecified: Secondary | ICD-10-CM | POA: Diagnosis not present

## 2021-10-25 MED ORDER — COLCHICINE 0.6 MG PO TABS: ORAL_TABLET | ORAL | 0 refills | Status: AC

## 2021-10-25 NOTE — Patient Instructions (Signed)
  Beryle Flock, thank you for joining Piedad Climes, PA-C for today's virtual visit.  While this provider is not your primary care provider (PCP), if your PCP is located in our provider database this encounter information will be shared with them immediately following your visit.  Consent: (Patient) [name] provided verbal consent for this virtual visit at the beginning of the encounter.  Current Medications:  Current Outpatient Medications:    colchicine 0.6 MG tablet, Take 2 tabs immediately, then 1 tab twice per day for the duration of the flare up to a max of 7 days, Disp: 21 tablet, Rfl: 0   Medications ordered in this encounter:  No orders of the defined types were placed in this encounter.    *If you need refills on other medications prior to your next appointment, please contact your pharmacy*  Follow-Up: Call back or seek an in-person evaluation if the symptoms worsen or if the condition fails to improve as anticipated.  Other Instructions Please take the colchicine as directed. Ok to continue your tart cherry supplement. Continue avoiding alcohol and purine-rich foods. Follow-up with your new PCP as scheduled.    If you have been instructed to have an in-person evaluation today at a local Urgent Care facility, please use the link below. It will take you to a list of all of our available [facility], including address, phone number and hours of operation. Please do not delay care.  [facility]  If you or a family member do not have a primary care provider, use the link below to schedule a visit and establish care. When you choose a [facility] primary care physician or advanced practice provider, you gain a long-term partner in health. Find a Primary Care Provider  Learn more about [facility]'s in-office and virtual care options: [facility] - Get Care Now

## 2021-10-25 NOTE — Progress Notes (Signed)
Virtual Visit Consent   Mason Harris, you are scheduled for a virtual visit with a [facility] provider today. Just as with appointments in the office, your consent must be obtained to participate. Your consent will be active for this visit and any virtual visit you may have with one of our providers in the next 365 days. If you have a MyChart account, a copy of this consent can be sent to you electronically.  As this is a virtual visit, video technology does not allow for your provider to perform a traditional examination. This may limit your provider's ability to fully assess your condition. If your provider identifies any concerns that need to be evaluated in person or the need to arrange testing (such as labs, EKG, etc.), we will make arrangements to do so. Although advances in technology are sophisticated, we cannot ensure that it will always work on either your end or our end. If the connection with a video visit is poor, the visit may have to be switched to a telephone visit. With either a video or telephone visit, we are not always able to ensure that we have a secure connection.  By engaging in this virtual visit, you consent to the provision of healthcare and authorize for your insurance to be billed (if applicable) for the services provided during this visit. Depending on your insurance coverage, you may receive a charge related to this service.  I need to obtain your verbal consent now. Are you willing to proceed with your visit today? Mason Harris has provided verbal consent on 10/25/2021 for a virtual visit (video or telephone). Piedad Climes, [place]  Date: 10/25/2021 6:01 PM  Virtual Visit via Video Note   I, Piedad Climes, connected with  Mason Harris  ([id number], 1976/03/06) on 10/25/21 at  5:00 PM EDT by a video-enabled telemedicine application and verified that I am speaking with the correct person using two identifiers.  Location: Patient: Virtual  Visit Location Patient: Home Provider: Virtual Visit Location Provider: Home Office   I discussed the limitations of evaluation and management by telemedicine and the availability of in person appointments. The patient expressed understanding and agreed to proceed.    History of Present Illness: Mason Harris is a 46 y.o. who identifies as a male who was assigned male at birth, and is being seen today for gout flare. Has history of gout flares in the R foot. Notes most recent flare of R great toe, spreading to side of foot. Denies fever or inability to move joint. Has appt upcoming with a new PCP so he can get started on preventive medication. Denies any alcohol. Limiting purines. Has been taking a tart cherry supplement OTC.   HPI: HPI  Problems:  Patient Active Problem List   Diagnosis Date Noted   Low back pain 12/02/2015    Allergies: No Known Allergies Medications:  Current Outpatient Medications:    colchicine 0.6 MG tablet, Take 2 tabs immediately, then 1 tab twice per day for the duration of the flare up to a max of 7 days, Disp: 21 tablet, Rfl: 0  Observations/Objective: Patient is well-developed, well-nourished in no acute distress.  Resting comfortably at home.  Head is normocephalic, atraumatic.  No labored breathing. Speech is clear and coherent with logical content.  Patient is alert and oriented at baseline.   Assessment and Plan: 1. Acute gout involving toe of right foot, unspecified cause - colchicine 0.6 MG tablet; Take 2 tabs immediately, then 1 tab twice  per day for the duration of the flare up to a max of 7 days  Dispense: 21 tablet; Refill: 0  Start Colchicine for acute flare. Supportive measures and OTC medications reviewed. Follow-up with new PCP as scheduled.   Follow Up Instructions: I discussed the assessment and treatment plan with the patient. The patient was provided an opportunity to ask questions and all were answered. The patient agreed with  the plan and demonstrated an understanding of the instructions.  A copy of instructions were sent to the patient via MyChart unless otherwise noted below.   The patient was advised to call back or seek an in-person evaluation if the symptoms worsen or if the condition fails to improve as anticipated.  Time:  I spent 10 minutes with the patient via telehealth technology discussing the above problems/concerns.    Piedad Climes, PA-C

## 2021-10-25 NOTE — Progress Notes (Signed)
This encounter was created in error - please disregard.

## 2021-11-17 ENCOUNTER — Encounter: Payer: Self-pay | Admitting: Internal Medicine

## 2021-11-17 ENCOUNTER — Ambulatory Visit: Payer: 59 | Admitting: Internal Medicine

## 2021-11-17 VITALS — BP 128/92 | HR 76 | Temp 98.0°F | Resp 16 | Ht 70.0 in | Wt 199.0 lb

## 2021-11-17 DIAGNOSIS — M1A09X0 Idiopathic chronic gout, multiple sites, without tophus (tophi): Secondary | ICD-10-CM | POA: Diagnosis not present

## 2021-11-17 DIAGNOSIS — Z23 Encounter for immunization: Secondary | ICD-10-CM

## 2021-11-17 DIAGNOSIS — Z0000 Encounter for general adult medical examination without abnormal findings: Secondary | ICD-10-CM | POA: Diagnosis not present

## 2021-11-17 DIAGNOSIS — I10 Essential (primary) hypertension: Secondary | ICD-10-CM | POA: Diagnosis not present

## 2021-11-17 DIAGNOSIS — R972 Elevated prostate specific antigen [PSA]: Secondary | ICD-10-CM

## 2021-11-17 DIAGNOSIS — I119 Hypertensive heart disease without heart failure: Secondary | ICD-10-CM | POA: Insufficient documentation

## 2021-11-17 DIAGNOSIS — R5382 Chronic fatigue, unspecified: Secondary | ICD-10-CM

## 2021-11-17 DIAGNOSIS — Z1159 Encounter for screening for other viral diseases: Secondary | ICD-10-CM

## 2021-11-17 DIAGNOSIS — F321 Major depressive disorder, single episode, moderate: Secondary | ICD-10-CM | POA: Diagnosis not present

## 2021-11-17 DIAGNOSIS — R0609 Other forms of dyspnea: Secondary | ICD-10-CM

## 2021-11-17 DIAGNOSIS — F17200 Nicotine dependence, unspecified, uncomplicated: Secondary | ICD-10-CM | POA: Insufficient documentation

## 2021-11-17 DIAGNOSIS — Z0001 Encounter for general adult medical examination with abnormal findings: Secondary | ICD-10-CM

## 2021-11-17 LAB — LIPID PANEL
Cholesterol: 255 mg/dL — ABNORMAL HIGH (ref 0–200)
HDL: 45.7 mg/dL (ref >39.00)
NonHDL: 209.34
Total CHOL/HDL Ratio: 6
Triglycerides: 317 mg/dL — ABNORMAL HIGH (ref 0.0–149.0)
VLDL: 63.4 mg/dL — ABNORMAL HIGH (ref 0.0–40.0)

## 2021-11-17 LAB — URINALYSIS, ROUTINE W REFLEX MICROSCOPIC
Bilirubin Urine: NEGATIVE
Hgb urine dipstick: NEGATIVE
Ketones, ur: NEGATIVE
Leukocytes,Ua: NEGATIVE
Nitrite: NEGATIVE
RBC / HPF: NONE SEEN (ref 0–?)
Specific Gravity, Urine: 1.01 (ref 1.000–1.030)
Total Protein, Urine: NEGATIVE
Urine Glucose: NEGATIVE
Urobilinogen, UA: 0.2 (ref 0.0–1.0)
pH: 7 (ref 5.0–8.0)

## 2021-11-17 LAB — CBC WITH DIFFERENTIAL/PLATELET
Basophils Absolute: 0 10*3/uL (ref 0.0–0.1)
Basophils Relative: 0.7 % (ref 0.0–3.0)
Eosinophils Absolute: 0.2 10*3/uL (ref 0.0–0.7)
Eosinophils Relative: 2.7 % (ref 0.0–5.0)
HCT: 49 % (ref 39.0–52.0)
Hemoglobin: 16.7 g/dL (ref 13.0–17.0)
Lymphocytes Relative: 42.2 % (ref 12.0–46.0)
Lymphs Abs: 2.8 10*3/uL (ref 0.7–4.0)
MCHC: 34.2 g/dL (ref 30.0–36.0)
MCV: 94.6 fl (ref 78.0–100.0)
Monocytes Absolute: 0.6 10*3/uL (ref 0.1–1.0)
Monocytes Relative: 9.2 % (ref 3.0–12.0)
Neutro Abs: 3 10*3/uL (ref 1.4–7.7)
Neutrophils Relative %: 45.2 % (ref 43.0–77.0)
Platelets: 203 10*3/uL (ref 150.0–400.0)
RBC: 5.18 Mil/uL (ref 4.22–5.81)
RDW: 14.1 % (ref 11.5–15.5)
WBC: 6.6 10*3/uL (ref 4.0–10.5)

## 2021-11-17 LAB — HEPATIC FUNCTION PANEL
ALT: 74 U/L — ABNORMAL HIGH (ref 0–53)
AST: 33 U/L (ref 0–37)
Albumin: 4.5 g/dL (ref 3.5–5.2)
Alkaline Phosphatase: 82 U/L (ref 39–117)
Bilirubin, Direct: 0.1 mg/dL (ref 0.0–0.3)
Total Bilirubin: 0.7 mg/dL (ref 0.2–1.2)
Total Protein: 7.2 g/dL (ref 6.0–8.3)

## 2021-11-17 LAB — BASIC METABOLIC PANEL
BUN: 19 mg/dL (ref 6–23)
CO2: 31 mEq/L (ref 19–32)
Calcium: 10.3 mg/dL (ref 8.4–10.5)
Chloride: 102 mEq/L (ref 96–112)
Creatinine, Ser: 1.08 mg/dL (ref 0.40–1.50)
GFR: 81.45 mL/min (ref >60.00)
Glucose, Bld: 109 mg/dL — ABNORMAL HIGH (ref 70–99)
Potassium: 4.3 mEq/L (ref 3.5–5.1)
Sodium: 142 mEq/L (ref 135–145)

## 2021-11-17 LAB — URIC ACID: Uric Acid, Serum: 7.5 mg/dL (ref 4.0–7.8)

## 2021-11-17 LAB — BRAIN NATRIURETIC PEPTIDE: Pro B Natriuretic peptide (BNP): 14 pg/mL (ref 0.0–100.0)

## 2021-11-17 LAB — TSH: TSH: 1.35 u[IU]/mL (ref 0.35–5.50)

## 2021-11-17 LAB — TROPONIN I (HIGH SENSITIVITY): High Sens Troponin I: 3 ng/L (ref 2–17)

## 2021-11-17 LAB — PSA: PSA: 3.99 ng/mL (ref 0.10–4.00)

## 2021-11-17 LAB — LDL CHOLESTEROL, DIRECT: Direct LDL: 116 mg/dL

## 2021-11-17 MED ORDER — ALLOPURINOL 100 MG PO TABS: 100.0000 mg | ORAL_TABLET | Freq: Every day | ORAL | 0 refills | Status: AC

## 2021-11-17 NOTE — Patient Instructions (Signed)
Hypertension, Adult High blood pressure (hypertension) is when the force of blood pumping through the arteries is too strong. The arteries are the blood vessels that carry blood from the heart throughout the body. Hypertension forces the heart to work harder to pump blood and may cause arteries to become narrow or stiff. Untreated or uncontrolled hypertension can lead to a heart attack, heart failure, a stroke, kidney disease, and other problems. A blood pressure reading consists of a higher number over a lower number. Ideally, your blood pressure should be below 120/80. The first ("top") number is called the systolic pressure. It is a measure of the pressure in your arteries as your heart beats. The second ("bottom") number is called the diastolic pressure. It is a measure of the pressure in your arteries as the heart relaxes. What are the causes? The exact cause of this condition is not known. There are some conditions that result in high blood pressure. What increases the risk? Certain factors may make you more likely to develop high blood pressure. Some of these risk factors are under your control, including: Smoking. Not getting enough exercise or physical activity. Being overweight. Having too much fat, sugar, calories, or salt (sodium) in your diet. Drinking too much alcohol. Other risk factors include: Having a personal history of heart disease, diabetes, high cholesterol, or kidney disease. Stress. Having a family history of high blood pressure and high cholesterol. Having obstructive sleep apnea. Age. The risk increases with age. What are the signs or symptoms? High blood pressure may not cause symptoms. Very high blood pressure (hypertensive crisis) may cause: Headache. Fast or irregular heartbeats (palpitations). Shortness of breath. Nosebleed. Nausea and vomiting. Vision changes. Severe chest pain, dizziness, and seizures. How is this diagnosed? This condition is diagnosed by  measuring your blood pressure while you are seated, with your arm resting on a flat surface, your legs uncrossed, and your feet flat on the floor. The cuff of the blood pressure monitor will be placed directly against the skin of your upper arm at the level of your heart. Blood pressure should be measured at least twice using the same arm. Certain conditions can cause a difference in blood pressure between your right and left arms. If you have a high blood pressure reading during one visit or you have normal blood pressure with other risk factors, you may be asked to: Return on a different day to have your blood pressure checked again. Monitor your blood pressure at home for 1 week or longer. If you are diagnosed with hypertension, you may have other blood or imaging tests to help your health care provider understand your overall risk for other conditions. How is this treated? This condition is treated by making healthy lifestyle changes, such as eating healthy foods, exercising more, and reducing your alcohol intake. You may be referred for counseling on a healthy diet and physical activity. Your health care provider may prescribe medicine if lifestyle changes are not enough to get your blood pressure under control and if: Your systolic blood pressure is above 130. Your diastolic blood pressure is above 80. Your personal target blood pressure may vary depending on your medical conditions, your age, and other factors. Follow these instructions at home: Eating and drinking  Eat a diet that is high in fiber and potassium, and low in sodium, added sugar, and fat. An example of this eating plan is called the DASH diet. DASH stands for Dietary Approaches to Stop Hypertension. To eat this way: Eat   plenty of fresh fruits and vegetables. Try to fill one half of your plate at each meal with fruits and vegetables. Eat whole grains, such as whole-wheat pasta, brown rice, or whole-grain bread. Fill about one  fourth of your plate with whole grains. Eat or drink low-fat dairy products, such as skim milk or low-fat yogurt. Avoid fatty cuts of meat, processed or cured meats, and poultry with skin. Fill about one fourth of your plate with lean proteins, such as fish, chicken without skin, beans, eggs, or tofu. Avoid pre-made and processed foods. These tend to be higher in sodium, added sugar, and fat. Reduce your daily sodium intake. Many people with hypertension should eat less than 1,500 mg of sodium a day. Do not drink alcohol if: Your health care provider tells you not to drink. You are pregnant, may be pregnant, or are planning to become pregnant. If you drink alcohol: Limit how much you have to: 0-1 drink a day for women. 0-2 drinks a day for men. Know how much alcohol is in your drink. In the [place] one drink equals one 12 oz bottle of beer (355 mL), one 5 oz glass of wine (148 mL), or one 1 oz glass of hard liquor (44 mL). Lifestyle  Work with your health care provider to maintain a healthy body weight or to lose weight. Ask what an ideal weight is for you. Get at least 30 minutes of exercise that causes your heart to beat faster (aerobic exercise) most days of the week. Activities may include walking, swimming, or biking. Include exercise to strengthen your muscles (resistance exercise), such as Pilates or lifting weights, as part of your weekly exercise routine. Try to do these types of exercises for 30 minutes at least 3 days a week. Do not use any products that contain nicotine or tobacco. These products include cigarettes, chewing tobacco, and vaping devices, such as e-cigarettes. If you need help quitting, ask your health care provider. Monitor your blood pressure at home as told by your health care provider. Keep all follow-up visits. This is important. Medicines Take over-the-counter and prescription medicines only as told by your health care provider. Follow directions carefully. Blood  pressure medicines must be taken as prescribed. Do not skip doses of blood pressure medicine. Doing this puts you at risk for problems and can make the medicine less effective. Ask your health care provider about side effects or reactions to medicines that you should watch for. Contact a health care provider if you: Think you are having a reaction to a medicine you are taking. Have headaches that keep coming back (recurring). Feel dizzy. Have swelling in your ankles. Have trouble with your vision. Get help right away if you: Develop a severe headache or confusion. Have unusual weakness or numbness. Feel faint. Have severe pain in your chest or abdomen. Vomit repeatedly. Have trouble breathing. These symptoms may be an emergency. Get help right away. Call 911. Do not wait to see if the symptoms will go away. Do not drive yourself to the hospital. Summary Hypertension is when the force of blood pumping through your arteries is too strong. If this condition is not controlled, it may put you at risk for serious complications. Your personal target blood pressure may vary depending on your medical conditions, your age, and other factors. For most people, a normal blood pressure is less than 120/80. Hypertension is treated with lifestyle changes, medicines, or a combination of both. Lifestyle changes include losing weight, eating a healthy,   low-sodium diet, exercising more, and limiting alcohol. This information is not intended to replace advice given to you by your health care provider. Make sure you discuss any questions you have with your health care provider. Document Revised: 12/28/2020 Document Reviewed: 12/28/2020 Elsevier Patient Education  2023 [employer]  

## 2021-11-17 NOTE — Progress Notes (Unsigned)
Subjective:  Patient ID: Mason Harris, male    DOB: 1975-06-29  Age: 46 y.o. MRN: [id number]  CC: Annual Exam and Hypertension   HPI Mason Harris presents for a CPX and to establish -   He complains of a several year history of fatigue and DOE. He denies CP, edema, or palpitations. He has had several episodes of gout.  A closed friend recently died and since then he has experienced some anxiety, insomnia, and anhedonia.  He says his symptoms are mild and he denies SI or HI.  History Edi has a past medical history of Gout.   He has a past surgical history that includes Appendectomy.   His family history includes Alcoholism in his brother and brother; Hyperlipidemia in his father; Hypertension in his father.He reports that he has been smoking cigarettes. He has a 6.25 pack-year smoking history. He has been exposed to tobacco smoke. He uses smokeless tobacco. He reports current alcohol use of about 7.0 standard drinks of alcohol per week. He reports that he does not use drugs.  Outpatient Medications Prior to Visit  Medication Sig Dispense Refill   colchicine 0.6 MG tablet Take 2 tabs immediately, then 1 tab twice per day for the duration of the flare up to a max of 7 days 21 tablet 0   No facility-administered medications prior to visit.    ROS Review of Systems  Constitutional:  Positive for fatigue and unexpected weight change (wt gain). Negative for appetite change, chills, diaphoresis and fever.  HENT: Negative.    Eyes: Negative.   Respiratory:  Positive for shortness of breath. Negative for cough, chest tightness and wheezing.   Cardiovascular:  Negative for chest pain, palpitations and leg swelling.  Gastrointestinal:  Negative for abdominal pain, constipation, diarrhea, nausea and vomiting.  Endocrine: Negative.   Genitourinary: Negative.  Negative for difficulty urinating.  Musculoskeletal: Negative.  Negative for arthralgias, joint swelling and  myalgias.  Skin: Negative.   Allergic/Immunologic: Negative.   Neurological: Negative.  Negative for dizziness, weakness and light-headedness.  Hematological:  Negative for adenopathy. Does not bruise/bleed easily.  Psychiatric/Behavioral:  Positive for dysphoric mood and sleep disturbance. Negative for behavioral problems. The patient is nervous/anxious.     Objective:  BP (!) 128/92 (BP Location: Left Arm, Patient Position: Sitting, Cuff Size: Large)   Pulse 76   Temp 98 F (36.7 C) (Oral)   Resp 16   Ht 5\' 10"  (1.778 m)   Wt 199 lb (90.3 kg)   SpO2 95%   BMI 28.55 kg/m   Physical Exam Vitals reviewed.  HENT:     Nose: Nose normal.     Mouth/Throat:     Mouth: Mucous membranes are moist.  Eyes:     General: No scleral icterus.    Conjunctiva/sclera: Conjunctivae normal.  Cardiovascular:     Rate and Rhythm: Normal rate. Rhythm irregularly irregular.     Pulses: Normal pulses.     Heart sounds: No murmur heard.    Comments: EKG- SR with marked SA, 64 bpm No LVH or Q waves Pulmonary:     Effort: Pulmonary effort is normal.     Breath sounds: No stridor. No wheezing, rhonchi or rales.  Abdominal:     General: Abdomen is flat.     Palpations: There is no mass.     Tenderness: There is no abdominal tenderness. There is no guarding.     Hernia: No hernia is present.  Musculoskeletal:  General: Normal range of motion.     Right lower leg: No edema.     Left lower leg: No edema.  Skin:    General: Skin is warm and dry.     Coloration: Skin is not pale.  Neurological:     General: No focal deficit present.     Mental Status: He is alert. Mental status is at baseline.  Psychiatric:        Mood and Affect: Mood normal.        Behavior: Behavior normal.        Thought Content: Thought content normal.     Lab Results  Component Value Date   WBC 6.6 11/17/2021   HGB 16.7 11/17/2021   HCT 49.0 11/17/2021   PLT 203.0 11/17/2021   GLUCOSE 109 (H) 11/17/2021    CHOL 255 (H) 11/17/2021   TRIG 317.0 (H) 11/17/2021   HDL 45.70 11/17/2021   LDLDIRECT 116.0 11/17/2021   ALT 74 (H) 11/17/2021   AST 33 11/17/2021   NA 142 11/17/2021   K 4.3 11/17/2021   CL 102 11/17/2021   CREATININE 1.08 11/17/2021   BUN 19 11/17/2021   CO2 31 11/17/2021   TSH 1.35 11/17/2021   PSA 3.99 11/17/2021     Assessment & Plan:   Mason Harris was seen today for annual exam and hypertension.  Diagnoses and all orders for this visit:  Encounter for general adult medical examination with abnormal findings- Exam completed, labs reviewed, vaccines reviewed and updated, cancer screenings addressed, patient education was given. -     Lipid panel; Future -     PSA; Future -     Hepatitis C antibody; Future -     HIV Antibody (routine testing w rflx); Future -     HIV Antibody (routine testing w rflx) -     Hepatitis C antibody -     PSA -     Lipid panel  DOE (dyspnea on exertion)- His labs are reassuring.  Will assess his risk for CAD with a coronary calcium score. -     Brain natriuretic peptide; Future -     Troponin I (High Sensitivity); Future -     CBC with Differential/Platelet; Future -     EKG 12-Lead -     CBC with Differential/Platelet -     Troponin I (High Sensitivity) -     Brain natriuretic peptide -     CT CARDIAC SCORING (SELF PAY ONLY); Future  Chronic fatigue- Labs are negative for secondary causes.  He is mildly depressed but does not want to take an antidepressant. -     Basic metabolic panel; Future -     TSH; Future -     Testosterone Total,Free,Bio, Males; Future -     Hepatic function panel; Future -     CBC with Differential/Platelet; Future -     CBC with Differential/Platelet -     Hepatic function panel -     Testosterone Total,Free,Bio, Males -     TSH -     Basic metabolic panel -     CT CARDIAC SCORING (SELF PAY ONLY); Future  Current moderate episode of major depressive disorder without prior episode [facility])- He prefers not  to take an antidepressant.  Need for hepatitis C screening test -     Hepatitis C antibody; Future -     Hepatitis C antibody  Tobacco use disorder, mild, abuse  Idiopathic chronic gout of multiple sites without tophus-  Will start a xanthine oxidase inhibitor to prevent gout attacks. -     Uric acid; Future -     Uric acid -     allopurinol (ZYLOPRIM) 100 MG tablet; Take 1 tablet (100 mg total) by mouth daily.  Primary hypertension- Will check labs to screen for secondary causes and endorgan damage.  He prefers not to take an antihypertensive. -     Cancel: Aldosterone + renin activity w/ ratio; Future -     Aldosterone + renin activity w/ ratio; Future -     Aldosterone + renin activity w/ ratio -     Urinalysis, Routine w reflex microscopic; Future -     Urinalysis, Routine w reflex microscopic  Elevated PSA -     Ambulatory referral to Urology  Other orders -     Flu Vaccine QUAD 6+ mos PF IM (Fluarix Quad PF) -     Tdap vaccine greater than or equal to 7yo IM -     LDL cholesterol, direct   I am having Mason Harris "Mason Harris" start on allopurinol. I am also having him maintain his colchicine.  Meds ordered this encounter  Medications   allopurinol (ZYLOPRIM) 100 MG tablet    Sig: Take 1 tablet (100 mg total) by mouth daily.    Dispense:  90 tablet    Refill:  0     Follow-up: Return in about 3 months (around 02/16/2022).  Sanda Linger, MD

## 2021-11-22 ENCOUNTER — Encounter: Payer: Self-pay | Admitting: Internal Medicine

## 2021-11-28 LAB — HEPATITIS C ANTIBODY: Hepatitis C Ab: NONREACTIVE

## 2021-11-28 LAB — HIV ANTIBODY (ROUTINE TESTING W REFLEX): HIV 1&2 Ab, 4th Generation: NONREACTIVE

## 2021-11-28 LAB — ALDOSTERONE + RENIN ACTIVITY W/ RATIO
ALDO / PRA Ratio: 1.8 Ratio (ref 0.9–28.9)
Aldosterone: 1 ng/dL
Renin Activity: 0.55 ng/mL/h (ref 0.25–5.82)

## 2021-11-28 LAB — TESTOSTERONE TOTAL,FREE,BIO, MALES
Albumin: 4.7 g/dL (ref 3.6–5.1)
Sex Hormone Binding: 46 nmol/L (ref 10–50)
Testosterone, Bioavailable: 123.4 ng/dL (ref 110.0–575.0)
Testosterone, Free: 57.6 pg/mL (ref 46.0–224.0)
Testosterone: 565 ng/dL (ref 250–827)

## 2021-12-07 LAB — COLOGUARD: Cologuard: NEGATIVE

## 2022-01-23 ENCOUNTER — Ambulatory Visit: Payer: 59 | Admitting: Internal Medicine

## 2022-02-08 ENCOUNTER — Encounter: Payer: Self-pay | Admitting: Internal Medicine

## 2022-02-08 ENCOUNTER — Ambulatory Visit: Payer: 59 | Admitting: Internal Medicine

## 2022-02-08 VITALS — BP 128/86 | HR 91 | Temp 97.8°F | Ht 70.0 in | Wt 200.0 lb

## 2022-02-08 DIAGNOSIS — R7989 Other specified abnormal findings of blood chemistry: Secondary | ICD-10-CM | POA: Insufficient documentation

## 2022-02-08 DIAGNOSIS — R972 Elevated prostate specific antigen [PSA]: Secondary | ICD-10-CM | POA: Diagnosis not present

## 2022-02-08 LAB — HEPATIC FUNCTION PANEL
ALT: 107 U/L — ABNORMAL HIGH (ref 0–53)
AST: 48 U/L — ABNORMAL HIGH (ref 0–37)
Albumin: 5.1 g/dL (ref 3.5–5.2)
Alkaline Phosphatase: 88 U/L (ref 39–117)
Bilirubin, Direct: 0.1 mg/dL (ref 0.0–0.3)
Total Bilirubin: 0.6 mg/dL (ref 0.2–1.2)
Total Protein: 7.7 g/dL (ref 6.0–8.3)

## 2022-02-08 LAB — PSA: PSA: 3.6 ng/mL (ref 0.10–4.00)

## 2022-02-08 NOTE — Patient Instructions (Signed)
Hypertension, Adult High blood pressure (hypertension) is when the force of blood pumping through the arteries is too strong. The arteries are the blood vessels that carry blood from the heart throughout the body. Hypertension forces the heart to work harder to pump blood and may cause arteries to become narrow or stiff. Untreated or uncontrolled hypertension can lead to a heart attack, heart failure, a stroke, kidney disease, and other problems. A blood pressure reading consists of a higher number over a lower number. Ideally, your blood pressure should be below 120/80. The first ("top") number is called the systolic pressure. It is a measure of the pressure in your arteries as your heart beats. The second ("bottom") number is called the diastolic pressure. It is a measure of the pressure in your arteries as the heart relaxes. What are the causes? The exact cause of this condition is not known. There are some conditions that result in high blood pressure. What increases the risk? Certain factors may make you more likely to develop high blood pressure. Some of these risk factors are under your control, including: Smoking. Not getting enough exercise or physical activity. Being overweight. Having too much fat, sugar, calories, or salt (sodium) in your diet. Drinking too much alcohol. Other risk factors include: Having a personal history of heart disease, diabetes, high cholesterol, or kidney disease. Stress. Having a family history of high blood pressure and high cholesterol. Having obstructive sleep apnea. Age. The risk increases with age. What are the signs or symptoms? High blood pressure may not cause symptoms. Very high blood pressure (hypertensive crisis) may cause: Headache. Fast or irregular heartbeats (palpitations). Shortness of breath. Nosebleed. Nausea and vomiting. Vision changes. Severe chest pain, dizziness, and seizures. How is this diagnosed? This condition is diagnosed by  measuring your blood pressure while you are seated, with your arm resting on a flat surface, your legs uncrossed, and your feet flat on the floor. The cuff of the blood pressure monitor will be placed directly against the skin of your upper arm at the level of your heart. Blood pressure should be measured at least twice using the same arm. Certain conditions can cause a difference in blood pressure between your right and left arms. If you have a high blood pressure reading during one visit or you have normal blood pressure with other risk factors, you may be asked to: Return on a different day to have your blood pressure checked again. Monitor your blood pressure at home for 1 week or longer. If you are diagnosed with hypertension, you may have other blood or imaging tests to help your health care provider understand your overall risk for other conditions. How is this treated? This condition is treated by making healthy lifestyle changes, such as eating healthy foods, exercising more, and reducing your alcohol intake. You may be referred for counseling on a healthy diet and physical activity. Your health care provider may prescribe medicine if lifestyle changes are not enough to get your blood pressure under control and if: Your systolic blood pressure is above 130. Your diastolic blood pressure is above 80. Your personal target blood pressure may vary depending on your medical conditions, your age, and other factors. Follow these instructions at home: Eating and drinking  Eat a diet that is high in fiber and potassium, and low in sodium, added sugar, and fat. An example of this eating plan is called the DASH diet. DASH stands for Dietary Approaches to Stop Hypertension. To eat this way: Eat   plenty of fresh fruits and vegetables. Try to fill one half of your plate at each meal with fruits and vegetables. Eat whole grains, such as whole-wheat pasta, brown rice, or whole-grain bread. Fill about one  fourth of your plate with whole grains. Eat or drink low-fat dairy products, such as skim milk or low-fat yogurt. Avoid fatty cuts of meat, processed or cured meats, and poultry with skin. Fill about one fourth of your plate with lean proteins, such as fish, chicken without skin, beans, eggs, or tofu. Avoid pre-made and processed foods. These tend to be higher in sodium, added sugar, and fat. Reduce your daily sodium intake. Many people with hypertension should eat less than 1,500 mg of sodium a day. Do not drink alcohol if: Your health care provider tells you not to drink. You are pregnant, may be pregnant, or are planning to become pregnant. If you drink alcohol: Limit how much you have to: 0-1 drink a day for women. 0-2 drinks a day for men. Know how much alcohol is in your drink. In the [place] one drink equals one 12 oz bottle of beer (355 mL), one 5 oz glass of wine (148 mL), or one 1 oz glass of hard liquor (44 mL). Lifestyle  Work with your health care provider to maintain a healthy body weight or to lose weight. Ask what an ideal weight is for you. Get at least 30 minutes of exercise that causes your heart to beat faster (aerobic exercise) most days of the week. Activities may include walking, swimming, or biking. Include exercise to strengthen your muscles (resistance exercise), such as Pilates or lifting weights, as part of your weekly exercise routine. Try to do these types of exercises for 30 minutes at least 3 days a week. Do not use any products that contain nicotine or tobacco. These products include cigarettes, chewing tobacco, and vaping devices, such as e-cigarettes. If you need help quitting, ask your health care provider. Monitor your blood pressure at home as told by your health care provider. Keep all follow-up visits. This is important. Medicines Take over-the-counter and prescription medicines only as told by your health care provider. Follow directions carefully. Blood  pressure medicines must be taken as prescribed. Do not skip doses of blood pressure medicine. Doing this puts you at risk for problems and can make the medicine less effective. Ask your health care provider about side effects or reactions to medicines that you should watch for. Contact a health care provider if you: Think you are having a reaction to a medicine you are taking. Have headaches that keep coming back (recurring). Feel dizzy. Have swelling in your ankles. Have trouble with your vision. Get help right away if you: Develop a severe headache or confusion. Have unusual weakness or numbness. Feel faint. Have severe pain in your chest or abdomen. Vomit repeatedly. Have trouble breathing. These symptoms may be an emergency. Get help right away. Call 911. Do not wait to see if the symptoms will go away. Do not drive yourself to the hospital. Summary Hypertension is when the force of blood pumping through your arteries is too strong. If this condition is not controlled, it may put you at risk for serious complications. Your personal target blood pressure may vary depending on your medical conditions, your age, and other factors. For most people, a normal blood pressure is less than 120/80. Hypertension is treated with lifestyle changes, medicines, or a combination of both. Lifestyle changes include losing weight, eating a healthy,   low-sodium diet, exercising more, and limiting alcohol. This information is not intended to replace advice given to you by your health care provider. Make sure you discuss any questions you have with your health care provider. Document Revised: 12/28/2020 Document Reviewed: 12/28/2020 Elsevier Patient Education  2023 [employer]  

## 2022-02-08 NOTE — Progress Notes (Signed)
Subjective:  Patient ID: Mason Harris, male    DOB: August 27, 1975  Age: 46 y.o. MRN: [id number]  CC: Hypertension   HPI Mason Harris presents for f/up -  He has decreased his alcohol intake.  He is active and denies headache, blurred vision, chest pain, shortness of breath, or abdominal pain.  He only gets 4 hours of sleep because he remains stressed about work.  Outpatient Medications Prior to Visit  Medication Sig Dispense Refill   allopurinol (ZYLOPRIM) 100 MG tablet Take 1 tablet (100 mg total) by mouth daily. 90 tablet 0   colchicine 0.6 MG tablet Take 2 tabs immediately, then 1 tab twice per day for the duration of the flare up to a max of 7 days 21 tablet 0   No facility-administered medications prior to visit.    ROS Review of Systems  Constitutional:  Positive for fatigue. Negative for appetite change, diaphoresis and unexpected weight change.  HENT: Negative.    Eyes: Negative.   Respiratory:  Negative for cough, chest tightness, shortness of breath and wheezing.   Cardiovascular:  Negative for chest pain, palpitations and leg swelling.  Gastrointestinal:  Negative for abdominal pain, diarrhea, nausea and vomiting.  Endocrine: Negative.   Genitourinary: Negative.  Negative for difficulty urinating, dysuria and hematuria.  Musculoskeletal: Negative.  Negative for arthralgias and myalgias.  Skin: Negative.   Allergic/Immunologic: Negative.   Neurological: Negative.  Negative for dizziness and weakness.  Hematological:  Negative for adenopathy. Does not bruise/bleed easily.  Psychiatric/Behavioral:  Positive for sleep disturbance.     Objective:  BP 128/86 (BP Location: Left Arm, Patient Position: Sitting, Cuff Size: Large)   Pulse 91   Temp 97.8 F (36.6 C) (Other (Comment))   Ht 5\' 10"  (1.778 m)   Wt 200 lb (90.7 kg)   SpO2 97%   BMI 28.70 kg/m   BP Readings from Last 3 Encounters:  02/08/22 128/86  11/17/21 (!) 128/92  03/15/17 118/80    Wt  Readings from Last 3 Encounters:  02/08/22 200 lb (90.7 kg)  11/17/21 199 lb (90.3 kg)  03/15/17 186 lb 12.8 oz (84.7 kg)    Physical Exam Vitals reviewed.  HENT:     Nose: Nose normal.     Mouth/Throat:     Mouth: Mucous membranes are moist.  Eyes:     General: No scleral icterus.    Conjunctiva/sclera: Conjunctivae normal.  Cardiovascular:     Rate and Rhythm: Normal rate and regular rhythm.     Heart sounds: No murmur heard. Pulmonary:     Effort: Pulmonary effort is normal.     Breath sounds: No stridor. No wheezing, rhonchi or rales.  Abdominal:     General: Abdomen is flat.     Palpations: There is no mass.     Tenderness: There is no abdominal tenderness. There is no guarding.     Hernia: No hernia is present.  Musculoskeletal:        General: Normal range of motion.     Cervical back: Neck supple.     Right lower leg: No edema.     Left lower leg: No edema.  Lymphadenopathy:     Cervical: No cervical adenopathy.  Skin:    General: Skin is warm and dry.  Neurological:     General: No focal deficit present.     Mental Status: He is alert.  Psychiatric:        Mood and Affect: Mood normal.  Behavior: Behavior normal.     Lab Results  Component Value Date   WBC 6.6 11/17/2021   HGB 16.7 11/17/2021   HCT 49.0 11/17/2021   PLT 203.0 11/17/2021   GLUCOSE 109 (H) 11/17/2021   CHOL 255 (H) 11/17/2021   TRIG 317.0 (H) 11/17/2021   HDL 45.70 11/17/2021   LDLDIRECT 116.0 11/17/2021   ALT 107 (H) 02/08/2022   AST 48 (H) 02/08/2022   NA 142 11/17/2021   K 4.3 11/17/2021   CL 102 11/17/2021   CREATININE 1.08 11/17/2021   BUN 19 11/17/2021   CO2 31 11/17/2021   TSH 1.35 11/17/2021   PSA 3.60 02/08/2022    No results found.  Assessment & Plan:   Mason Harris was seen today for hypertension.  Diagnoses and all orders for this visit:  PSA elevation- His PSA is lower than it was previously.  This is a reassuring sign that he does not have prostate  cancer. -     PSA; Future -     PSA  Elevated LFTs- His LFTs remain elevated.  Screening for viral hepatitis is negative.  I have asked him to undergo an ultrasound to see if there is fatty liver disease. -     Hepatitis A antibody, total; Future -     Hepatitis B core antibody, total; Future -     Hepatitis B surface antigen; Future -     Hepatitis B surface antibody,quantitative; Future -     Hepatic function panel; Future -     Hepatic function panel -     Hepatitis B surface antibody,quantitative -     Hepatitis B surface antigen -     Hepatitis B core antibody, total -     Hepatitis A antibody, total -     US Abdomen Limited RUQ (LIVER/GB); Future   I am having Mason Harris "Mason Harris" maintain his colchicine and allopurinol.  No orders of the defined types were placed in this encounter.    Follow-up: Return in about 6 months (around 08/10/2022).  Sanda Linger, MD

## 2022-02-09 LAB — HEPATITIS B SURFACE ANTIGEN: Hepatitis B Surface Ag: NONREACTIVE

## 2022-02-09 LAB — HEPATITIS B CORE ANTIBODY, TOTAL: Hep B Core Total Ab: NONREACTIVE

## 2022-02-09 LAB — HEPATITIS A ANTIBODY, TOTAL: Hepatitis A AB,Total: NONREACTIVE

## 2022-02-09 LAB — HEPATITIS B SURFACE ANTIBODY, QUANTITATIVE: Hep B S AB Quant (Post): <5 m[IU]/mL — ABNORMAL LOW (ref >=10)

## 2022-02-22 ENCOUNTER — Ambulatory Visit
Admission: RE | Admit: 2022-02-22 | Discharge: 2022-02-22 | Disposition: A | Discharge status: Home / Self Care | Payer: 59 | Source: Ambulatory Visit | Attending: Internal Medicine | Admitting: Internal Medicine

## 2022-02-22 ENCOUNTER — Other Ambulatory Visit: Payer: Self-pay | Admitting: Internal Medicine

## 2022-02-22 DIAGNOSIS — K7689 Other specified diseases of liver: Secondary | ICD-10-CM | POA: Insufficient documentation

## 2022-02-22 DIAGNOSIS — R7989 Other specified abnormal findings of blood chemistry: Secondary | ICD-10-CM

## 2022-02-22 DIAGNOSIS — K769 Liver disease, unspecified: Secondary | ICD-10-CM

## 2022-02-28 ENCOUNTER — Encounter: Payer: Self-pay | Admitting: Internal Medicine

## 2022-03-18 ENCOUNTER — Ambulatory Visit
Admission: RE | Admit: 2022-03-18 | Discharge: 2022-03-18 | Disposition: A | Discharge status: Home / Self Care | Payer: 59 | Source: Ambulatory Visit | Attending: Internal Medicine | Admitting: Internal Medicine

## 2022-03-18 DIAGNOSIS — K7689 Other specified diseases of liver: Secondary | ICD-10-CM

## 2022-03-18 DIAGNOSIS — K769 Liver disease, unspecified: Secondary | ICD-10-CM

## 2022-03-18 DIAGNOSIS — R7989 Other specified abnormal findings of blood chemistry: Secondary | ICD-10-CM

## 2022-03-18 MED ORDER — GADOPICLENOL 0.5 MMOL/ML IV SOLN
9.0000 mL | Freq: Once | INTRAVENOUS | Status: AC | PRN
  Administered 2022-03-18: 9 mL via INTRAVENOUS
